# Patient Record
Sex: Female | Born: 1965 | Hispanic: Yes | Smoking: Never smoker
Health system: Southern US, Community
[De-identification: ages and names within clinical notes are randomized; demographics above are authoritative.]

## PROBLEM LIST (undated history)

## (undated) DIAGNOSIS — E785 Hyperlipidemia, unspecified: Secondary | ICD-10-CM

## (undated) DIAGNOSIS — K219 Gastro-esophageal reflux disease without esophagitis: Secondary | ICD-10-CM

## (undated) DIAGNOSIS — F419 Anxiety disorder, unspecified: Secondary | ICD-10-CM

## (undated) DIAGNOSIS — F32A Depression, unspecified: Secondary | ICD-10-CM

## (undated) DIAGNOSIS — E079 Disorder of thyroid, unspecified: Secondary | ICD-10-CM

## (undated) DIAGNOSIS — Z77090 Contact with and (suspected) exposure to asbestos: Secondary | ICD-10-CM

## (undated) DIAGNOSIS — J45909 Unspecified asthma, uncomplicated: Secondary | ICD-10-CM

## (undated) HISTORY — DX: Depression, unspecified: F32.A

## (undated) HISTORY — DX: Hyperlipidemia, unspecified: E78.5

## (undated) HISTORY — DX: Gastro-esophageal reflux disease without esophagitis: K21.9

## (undated) HISTORY — PX: ABDOMINAL HYSTERECTOMY: SHX81

## (undated) HISTORY — PX: TUBAL LIGATION: SHX77

## (undated) HISTORY — DX: Anxiety disorder, unspecified: F41.9

---

## 2005-04-13 ENCOUNTER — Ambulatory Visit (HOSPITAL_COMMUNITY): Admission: RE | Admit: 2005-04-13 | Discharge: 2005-04-13 | Payer: Self-pay | Admitting: Family Medicine

## 2009-07-12 ENCOUNTER — Ambulatory Visit: Payer: Self-pay | Admitting: Obstetrics & Gynecology

## 2009-07-12 LAB — CONVERTED CEMR LAB
HCT: 30.6 % — ABNORMAL LOW (ref 36.0–46.0)
Hemoglobin: 8.7 g/dL — ABNORMAL LOW (ref 12.0–15.0)
MCHC: 28.4 g/dL — ABNORMAL LOW (ref 30.0–36.0)
MCV: 65.7 fL — ABNORMAL LOW (ref 78.0–100.0)
Platelets: 368 10*3/uL (ref 150–400)
RBC: 4.66 M/uL (ref 3.87–5.11)
RDW: 22.6 % — ABNORMAL HIGH (ref 11.5–15.5)
WBC: 6.2 10*3/uL (ref 4.0–10.5)

## 2009-08-12 ENCOUNTER — Ambulatory Visit: Payer: Self-pay | Admitting: Obstetrics and Gynecology

## 2009-08-12 ENCOUNTER — Inpatient Hospital Stay (HOSPITAL_COMMUNITY): Admission: RE | Admit: 2009-08-12 | Discharge: 2009-08-14 | Payer: Self-pay | Admitting: Obstetrics and Gynecology

## 2009-08-19 ENCOUNTER — Ambulatory Visit: Payer: Self-pay | Admitting: Obstetrics & Gynecology

## 2009-08-28 ENCOUNTER — Ambulatory Visit: Payer: Self-pay | Admitting: Obstetrics & Gynecology

## 2009-09-25 ENCOUNTER — Ambulatory Visit: Payer: Self-pay | Admitting: Obstetrics and Gynecology

## 2009-10-14 ENCOUNTER — Encounter: Admission: RE | Admit: 2009-10-14 | Discharge: 2009-10-14 | Payer: Self-pay | Admitting: Specialist

## 2009-12-18 ENCOUNTER — Encounter: Admission: RE | Admit: 2009-12-18 | Discharge: 2009-12-18 | Payer: Self-pay | Admitting: Specialist

## 2010-10-12 ENCOUNTER — Encounter: Payer: Self-pay | Admitting: Specialist

## 2010-10-12 ENCOUNTER — Encounter: Payer: Self-pay | Admitting: Family Medicine

## 2010-12-24 LAB — CROSSMATCH
ABO/RH(D): B POS
Antibody Screen: NEGATIVE

## 2010-12-24 LAB — CBC
HCT: 26.2 % — ABNORMAL LOW (ref 36.0–46.0)
HCT: 33.8 % — ABNORMAL LOW (ref 36.0–46.0)
Hemoglobin: 10.6 g/dL — ABNORMAL LOW (ref 12.0–15.0)
Hemoglobin: 8.3 g/dL — ABNORMAL LOW (ref 12.0–15.0)
MCHC: 31.4 g/dL (ref 30.0–36.0)
MCHC: 31.5 g/dL (ref 30.0–36.0)
MCV: 71.2 fL — ABNORMAL LOW (ref 78.0–100.0)
MCV: 71.8 fL — ABNORMAL LOW (ref 78.0–100.0)
Platelets: 268 10*3/uL (ref 150–400)
Platelets: 337 10*3/uL (ref 150–400)
RBC: 3.65 MIL/uL — ABNORMAL LOW (ref 3.87–5.11)
RBC: 4.75 MIL/uL (ref 3.87–5.11)
RDW: 25.5 % — ABNORMAL HIGH (ref 11.5–15.5)
RDW: 26.3 % — ABNORMAL HIGH (ref 11.5–15.5)
WBC: 10.8 10*3/uL — ABNORMAL HIGH (ref 4.0–10.5)
WBC: 8.1 10*3/uL (ref 4.0–10.5)

## 2010-12-24 LAB — ABO/RH: ABO/RH(D): B POS

## 2010-12-24 LAB — PREGNANCY, URINE: Preg Test, Ur: NEGATIVE

## 2019-06-21 ENCOUNTER — Emergency Department (HOSPITAL_COMMUNITY)
Admission: EM | Admit: 2019-06-21 | Discharge: 2019-06-21 | Disposition: A | Discharge status: Home / Self Care | Payer: Medicaid Other | Attending: Emergency Medicine | Admitting: Emergency Medicine

## 2019-06-21 ENCOUNTER — Emergency Department (HOSPITAL_COMMUNITY): Payer: Medicaid Other

## 2019-06-21 ENCOUNTER — Other Ambulatory Visit: Payer: Self-pay

## 2019-06-21 ENCOUNTER — Encounter (HOSPITAL_COMMUNITY): Payer: Self-pay

## 2019-06-21 DIAGNOSIS — R1011 Right upper quadrant pain: Secondary | ICD-10-CM | POA: Insufficient documentation

## 2019-06-21 DIAGNOSIS — R748 Abnormal levels of other serum enzymes: Secondary | ICD-10-CM | POA: Diagnosis not present

## 2019-06-21 DIAGNOSIS — R112 Nausea with vomiting, unspecified: Secondary | ICD-10-CM | POA: Insufficient documentation

## 2019-06-21 DIAGNOSIS — R7989 Other specified abnormal findings of blood chemistry: Secondary | ICD-10-CM

## 2019-06-21 DIAGNOSIS — R109 Unspecified abdominal pain: Secondary | ICD-10-CM | POA: Diagnosis present

## 2019-06-21 HISTORY — DX: Disorder of thyroid, unspecified: E07.9

## 2019-06-21 HISTORY — DX: Contact with and (suspected) exposure to asbestos: Z77.090

## 2019-06-21 LAB — CBC
HCT: 41.9 % (ref 36.0–46.0)
Hemoglobin: 14 g/dL (ref 12.0–15.0)
MCH: 27.8 pg (ref 26.0–34.0)
MCHC: 33.4 g/dL (ref 30.0–36.0)
MCV: 83.1 fL (ref 80.0–100.0)
Platelets: 301 10*3/uL (ref 150–400)
RBC: 5.04 MIL/uL (ref 3.87–5.11)
RDW: 13.2 % (ref 11.5–15.5)
WBC: 7.4 10*3/uL (ref 4.0–10.5)
nRBC: 0 % (ref 0.0–0.2)

## 2019-06-21 LAB — COMPREHENSIVE METABOLIC PANEL
ALT: 155 U/L — ABNORMAL HIGH (ref 0–44)
AST: 123 U/L — ABNORMAL HIGH (ref 15–41)
Albumin: 4.3 g/dL (ref 3.5–5.0)
Alkaline Phosphatase: 122 U/L (ref 38–126)
Anion gap: 12 (ref 5–15)
BUN: 5 mg/dL — ABNORMAL LOW (ref 6–20)
CO2: 24 mmol/L (ref 22–32)
Calcium: 9.2 mg/dL (ref 8.9–10.3)
Chloride: 100 mmol/L (ref 98–111)
Creatinine, Ser: 0.49 mg/dL (ref 0.44–1.00)
GFR calc Af Amer: >60 mL/min (ref >60)
GFR calc non Af Amer: >60 mL/min (ref >60)
Glucose, Bld: 117 mg/dL — ABNORMAL HIGH (ref 70–99)
Potassium: 3.9 mmol/L (ref 3.5–5.1)
Sodium: 136 mmol/L (ref 135–145)
Total Bilirubin: 0.8 mg/dL (ref 0.3–1.2)
Total Protein: 7.8 g/dL (ref 6.5–8.1)

## 2019-06-21 LAB — URINALYSIS, ROUTINE W REFLEX MICROSCOPIC
Bilirubin Urine: NEGATIVE
Glucose, UA: NEGATIVE mg/dL
Hgb urine dipstick: NEGATIVE
Ketones, ur: 20 mg/dL — AB
Leukocytes,Ua: NEGATIVE
Nitrite: NEGATIVE
Protein, ur: NEGATIVE mg/dL
Specific Gravity, Urine: 1.005 (ref 1.005–1.030)
pH: 7 (ref 5.0–8.0)

## 2019-06-21 LAB — LIPASE, BLOOD: Lipase: 35 U/L (ref 11–51)

## 2019-06-21 MED ORDER — SODIUM CHLORIDE 0.9% FLUSH: 3.0000 mL | Freq: Once | INTRAVENOUS | Status: DC

## 2019-06-21 MED ORDER — ONDANSETRON HCL 4 MG/2ML IJ SOLN
4.0000 mg | Freq: Once | INTRAMUSCULAR | Status: AC
  Administered 2019-06-21: 4 mg via INTRAVENOUS
  Filled 2019-06-21: qty 2

## 2019-06-21 MED ORDER — DICYCLOMINE HCL 10 MG PO CAPS
10.0000 mg | ORAL_CAPSULE | Freq: Once | ORAL | Status: AC
  Administered 2019-06-21: 10 mg via ORAL

## 2019-06-21 MED ORDER — PROMETHAZINE HCL 25 MG/ML IJ SOLN
12.5000 mg | Freq: Once | INTRAMUSCULAR | Status: AC
  Administered 2019-06-21: 12.5 mg via INTRAVENOUS
  Filled 2019-06-21: qty 1

## 2019-06-21 MED ORDER — PROMETHAZINE HCL 25 MG PO TABS: 25.0000 mg | ORAL_TABLET | Freq: Four times a day (QID) | ORAL | 0 refills | Status: DC | PRN

## 2019-06-21 NOTE — ED Notes (Signed)
Attempted to give pt bentyl, pt is in the restroom vomiting.

## 2019-06-21 NOTE — ED Provider Notes (Signed)
Emergency Department Provider Note   I have reviewed the triage vital signs and the nursing notes.   HISTORY  Chief Complaint Abdominal Pain   HPI Jennifer Wright is a 53 y.o. female with PMH of asbestos exposure presents to the emergency department for evaluation of nausea, vomiting, abdominal pain for the past 3 days.  Patient describes pain as being worse top of her abdomen and somewhat worse with eating.  She was exposed to a sick grandchild last week with a stomach virus but did not immediately develop symptoms.  She has had some mild diarrhea and felt somewhat shaky this morning.  She denied specific chest pain but states that she does have some pain in her lungs which is been an ongoing issue and thought to be related to asbestos exposure. No other radiation of symptoms or modifying factors.   Past Medical History:  Diagnosis Date  . Asbestos exposure   . Thyroid disease     There are no active problems to display for this patient.   Past Surgical History:  Procedure Laterality Date  . ABDOMINAL HYSTERECTOMY      Allergies Patient has no known allergies.  No family history on file.  Social History Social History   Tobacco Use  . Smoking status: Never Smoker  Substance Use Topics  . Alcohol use: Never    Frequency: Never  . Drug use: Never    Review of Systems  Constitutional: No fever/chills Eyes: No visual changes. ENT: No sore throat. Cardiovascular: Denies chest pain. Respiratory: Denies shortness of breath. Gastrointestinal: Positive abdominal pain.  Positive nausea, vomiting, and diarrhea.  No constipation. Genitourinary: Negative for dysuria. Musculoskeletal: Negative for back pain. Skin: Negative for rash. Neurological: Negative for headaches, focal weakness or numbness.  10-point ROS otherwise negative.  ____________________________________________   PHYSICAL EXAM:  VITAL SIGNS: ED Triage Vitals  Enc Vitals Group     BP 06/21/19  0954 107/73     Pulse Rate 06/21/19 0954 75     Resp 06/21/19 0955 16     Temp 06/21/19 0954 97.8 F (36.6 C)     Temp Source 06/21/19 0954 Oral     SpO2 06/21/19 0954 98 %     Weight 06/21/19 0951 125 lb (56.7 kg)     Height 06/21/19 0951 5\' 2"  (1.575 m)   Constitutional: Alert and oriented. Well appearing and in no acute distress. Eyes: Conjunctivae are normal.  Head: Atraumatic. Nose: No congestion/rhinnorhea. Mouth/Throat: Mucous membranes are moist.  Neck: No stridor.   Cardiovascular: Normal rate, regular rhythm. Good peripheral circulation. Grossly normal heart sounds.   Respiratory: Normal respiratory effort.  No retractions. Lungs CTAB. Gastrointestinal: Soft with mild diffuse pain worse in the RUQ. No distention.  Musculoskeletal: No lower extremity tenderness nor edema. No gross deformities of extremities. Neurologic:  Normal speech and language. No gross focal neurologic deficits are appreciated.  Skin:  Skin is warm, dry and intact. No rash noted.  ____________________________________________   LABS (all labs ordered are listed, but only abnormal results are displayed)  Labs Reviewed  COMPREHENSIVE METABOLIC PANEL - Abnormal; Notable for the following components:      Result Value   Glucose, Bld 117 (*)    BUN 5 (*)    AST 123 (*)    ALT 155 (*)    All other components within normal limits  URINALYSIS, ROUTINE W REFLEX MICROSCOPIC - Abnormal; Notable for the following components:   APPearance HAZY (*)    Ketones,  ur 20 (*)    All other components within normal limits  LIPASE, BLOOD  CBC   ____________________________________________  RADIOLOGY  Dg Chest 2 View  Result Date: 06/21/2019 CLINICAL DATA:  Chest pain EXAM: CHEST - 2 VIEW COMPARISON:  10/14/2009 FINDINGS: Heart and mediastinal contours are within normal limits. No focal opacities or effusions. No acute bony abnormality. IMPRESSION: No active cardiopulmonary disease. Electronically Signed   By:  Rolm Baptise M.D.   On: 06/21/2019 10:31   US Abdomen Limited Ruq  Result Date: 06/21/2019 CLINICAL DATA:  Right upper quadrant pain EXAM: ULTRASOUND ABDOMEN LIMITED RIGHT UPPER QUADRANT COMPARISON:  None. FINDINGS: Gallbladder: No gallstones or wall thickening visualized. No sonographic Murphy sign noted by sonographer. Common bile duct: Diameter: 5.3 mm Liver: No focal lesion identified. Increased hepatic parenchymal echogenicity. Portal vein is patent on color Doppler imaging with normal direction of blood flow towards the liver. Other: None. IMPRESSION: 1. No cholelithiasis or sonographic evidence of acute cholecystitis. 2. Increased hepatic echogenicity as can be seen with hepatic steatosis. Electronically Signed   By: Kathreen Devoid   On: 06/21/2019 11:13    ____________________________________________   PROCEDURES  Procedure(s) performed:   Procedures  None  ____________________________________________   INITIAL IMPRESSION / ASSESSMENT AND PLAN / ED COURSE  Pertinent labs & imaging results that were available during my care of the patient were reviewed by me and considered in my medical decision making (see chart for details).   Patient presents to the emergency department for evaluation of abdominal pain with nausea, vomiting, diarrhea.  Differential includes viral etiology but increase suspicion for gallbladder pathology.  He does have more focal tenderness in the epigastric and right upper quadrant region.  Afebrile so doubt hepatitis.  Plan to obtain labs, right upper quadrant ultrasound, Zofran.  Will obtain chest x-ray but patient's respiratory symptoms seem more chronic.   Patient feeling improved after Phenergan.  She is drinking fluids in the emergency department without vomiting.  She is ambulatory in the department without difficulty.  Vital signs remain within normal limits.  Discussed PCP follow-up for repeat liver labs in 1 week with the patient and daughter at bedside  using interpreter.  Discussed ED return precautions in detail. ____________________________________________  FINAL CLINICAL IMPRESSION(S) / ED DIAGNOSES  Final diagnoses:  RUQ abdominal pain  Elevated LFTs  Non-intractable vomiting with nausea, unspecified vomiting type     MEDICATIONS GIVEN DURING THIS VISIT:  Medications  ondansetron (ZOFRAN) injection 4 mg (4 mg Intravenous Given 06/21/19 1029)  dicyclomine (BENTYL) capsule 10 mg (10 mg Oral Given 06/21/19 1403)  promethazine (PHENERGAN) injection 12.5 mg (12.5 mg Intravenous Given 06/21/19 1232)     NEW OUTPATIENT MEDICATIONS STARTED DURING THIS VISIT:  Discharge Medication List as of 06/21/2019  1:51 PM    START taking these medications   Details  promethazine (PHENERGAN) 25 MG tablet Take 1 tablet (25 mg total) by mouth every 6 (six) hours as needed for nausea or vomiting., Starting Wed 06/21/2019, Print        Note:  This document was prepared using Dragon voice recognition software and may include unintentional dictation errors.  Nanda Quinton, MD, [facility] Emergency Medicine    Kesha Hurrell, [facility], MD 06/22/19 [phone]

## 2019-06-21 NOTE — ED Triage Notes (Addendum)
Pt reports skin sore to touch for 3 days. Reports N/V for 3 days. Last vomiting 10 mins ago Bile substance  Some diarrhea, reports shaky this morning. Grandchild had stomach virus last week

## 2019-06-21 NOTE — Discharge Instructions (Signed)
Hoy lo vieron en el departamento de emergencias con nuseas, vmitos, dolor abdominal. Sus enzimas hepticas estaban ligeramente elevadas. Esto puede deberse a un virus. Necesita repetir [employer] en 1 semana. Comunquese con su mdico de atencin primaria para esto. Regrese al departamento de emergencias con fiebre, dolor que [place], vmitos que [place]. Puede tomar Phenergan segn sea necesario para las nuseas y los vmitos. Llame a su mdico de atencin primaria hoy para programar una cita.    You were seen in the emergency department today with nausea, vomiting, abdominal pain.  Your liver enzymes were slightly elevated.  This may be from a virus.  You need to have these labs repeated in 1 week.  Please contact your primary care doctor for this.  Please return to the emergency department with any fever, worsening pain, worsening vomiting.  You may take the Phenergan as needed for nausea and vomiting.  Please call your primary care doctor today to schedule an appointment.

## 2019-12-07 ENCOUNTER — Encounter: Payer: Self-pay | Admitting: Family Medicine

## 2019-12-07 ENCOUNTER — Encounter (INDEPENDENT_AMBULATORY_CARE_PROVIDER_SITE_OTHER): Payer: Self-pay | Admitting: *Deleted

## 2019-12-07 ENCOUNTER — Other Ambulatory Visit: Payer: Self-pay

## 2019-12-07 ENCOUNTER — Ambulatory Visit (INDEPENDENT_AMBULATORY_CARE_PROVIDER_SITE_OTHER): Payer: Medicaid Other | Admitting: Family Medicine

## 2019-12-07 VITALS — BP 108/78 | HR 72 | Temp 97.1°F | Ht 62.0 in | Wt 134.0 lb

## 2019-12-07 DIAGNOSIS — M542 Cervicalgia: Secondary | ICD-10-CM

## 2019-12-07 DIAGNOSIS — R0602 Shortness of breath: Secondary | ICD-10-CM

## 2019-12-07 DIAGNOSIS — R519 Headache, unspecified: Secondary | ICD-10-CM

## 2019-12-07 DIAGNOSIS — M549 Dorsalgia, unspecified: Secondary | ICD-10-CM

## 2019-12-07 DIAGNOSIS — Z833 Family history of diabetes mellitus: Secondary | ICD-10-CM

## 2019-12-07 DIAGNOSIS — Z77090 Contact with and (suspected) exposure to asbestos: Secondary | ICD-10-CM

## 2019-12-07 DIAGNOSIS — E039 Hypothyroidism, unspecified: Secondary | ICD-10-CM | POA: Diagnosis not present

## 2019-12-07 DIAGNOSIS — E611 Iron deficiency: Secondary | ICD-10-CM

## 2019-12-07 DIAGNOSIS — Z83438 Family history of other disorder of lipoprotein metabolism and other lipidemia: Secondary | ICD-10-CM

## 2019-12-07 DIAGNOSIS — E559 Vitamin D deficiency, unspecified: Secondary | ICD-10-CM | POA: Diagnosis not present

## 2019-12-07 DIAGNOSIS — Z789 Other specified health status: Secondary | ICD-10-CM | POA: Insufficient documentation

## 2019-12-07 DIAGNOSIS — M25519 Pain in unspecified shoulder: Secondary | ICD-10-CM

## 2019-12-07 DIAGNOSIS — F321 Major depressive disorder, single episode, moderate: Secondary | ICD-10-CM | POA: Insufficient documentation

## 2019-12-07 DIAGNOSIS — Z1211 Encounter for screening for malignant neoplasm of colon: Secondary | ICD-10-CM

## 2019-12-07 HISTORY — DX: Family history of other disorder of lipoprotein metabolism and other lipidemia: Z83.438

## 2019-12-07 HISTORY — DX: Cervicalgia: M54.2

## 2019-12-07 HISTORY — DX: Headache, unspecified: R51.9

## 2019-12-07 HISTORY — DX: Dorsalgia, unspecified: M54.9

## 2019-12-07 HISTORY — DX: Shortness of breath: R06.02

## 2019-12-07 HISTORY — DX: Family history of diabetes mellitus: Z83.3

## 2019-12-07 HISTORY — DX: Pain in unspecified shoulder: M25.519

## 2019-12-07 HISTORY — DX: Encounter for screening for malignant neoplasm of colon: Z12.11

## 2019-12-07 NOTE — Assessment & Plan Note (Signed)
Needs updated labs denies having any signs or symptoms of hypothyroidism at this time.

## 2019-12-07 NOTE — Assessment & Plan Note (Signed)
Continue over-the-counter treatments at this time.

## 2019-12-07 NOTE — Assessment & Plan Note (Signed)
Continues to have shortness of breath uses her rescue inhaler several times.  Has never truly been checked but used to work with asbestos removal.  Is willing to get a pulmonology referral made today

## 2019-12-07 NOTE — Assessment & Plan Note (Signed)
Work with clearing asbestos.  Is on Symbicort and uses short acting inhaler frequently.  Referral to pulmonologist

## 2019-12-07 NOTE — Assessment & Plan Note (Signed)
Needs updated labs to see if she needs prescription strength supplement

## 2019-12-07 NOTE — Assessment & Plan Note (Signed)
Unsure of true cause here she reports has been lasting for a long time ever since she was out of being incarcerated.  Possibly could be related to mood and depression.  We will follow-up after therapy

## 2019-12-07 NOTE — Assessment & Plan Note (Signed)
Will be getting labs she is encouraged to maintain a low-fat diet.

## 2019-12-07 NOTE — Assessment & Plan Note (Signed)
Unsure of true cause here she reports has been lasting for a long time ever since she was out of being incarcerated.  Possibly could be related to mood and depression.  We will follow-up after therapy 

## 2019-12-07 NOTE — Assessment & Plan Note (Signed)
Needs updated labs to see if she needs supplementation.
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
## 2019-12-07 NOTE — Assessment & Plan Note (Signed)
Predominantly Spanish-speaking does understand some [place].  Daughter was with her to help with translation.

## 2019-12-07 NOTE — Assessment & Plan Note (Signed)
Colonoscopy need referral made today

## 2019-12-07 NOTE — Assessment & Plan Note (Signed)
Is seeing a group that manages her medications they are unsure of the name of the group they will call back with the name of the group. Until then we will keep everything the same.  But will refer to behavioral health for therapy, as she would like to leave the group that she is currently bit because she does not feel like she gets what she needs. Denies having any SI and HI at this time.

## 2019-12-07 NOTE — Patient Instructions (Addendum)
I appreciate the opportunity to provide you with care for your health and wellness. Today we discussed: establish care   Follow up: 3 months   Labs today  Referrals to GI-colonoscopy, Therapy and Pulmonology-lung dr  If not feeling better in back and neck, we will look at PT referral Until then increase walking and stretching daily  Please continue to practice social distancing to keep you, your family, and our community safe.  If you must go out, please wear a mask and practice good handwashing.  It was a pleasure to see you and I look forward to continuing to work together on your health and well-being. Please do not hesitate to call the office if you need care or have questions about your care.  Have a wonderful day and week. With Gratitude, Tereasa Coop, DNP, AGNP-BC

## 2019-12-07 NOTE — Assessment & Plan Note (Signed)
Needs updated baseline labs. She is encouraged to maintain a healthy diet low in carbohydrates and sugars

## 2019-12-08 ENCOUNTER — Other Ambulatory Visit: Payer: Self-pay | Admitting: Family Medicine

## 2019-12-08 DIAGNOSIS — E559 Vitamin D deficiency, unspecified: Secondary | ICD-10-CM

## 2019-12-08 DIAGNOSIS — E782 Mixed hyperlipidemia: Secondary | ICD-10-CM

## 2019-12-08 LAB — COMPLETE METABOLIC PANEL WITH GFR
AG Ratio: 1.6 (calc) (ref 1.0–2.5)
ALT: 28 U/L (ref 6–29)
AST: 34 U/L (ref 10–35)
Albumin: 4.4 g/dL (ref 3.6–5.1)
Alkaline phosphatase (APISO): 107 U/L (ref 37–153)
BUN: 12 mg/dL (ref 7–25)
CO2: 28 mmol/L (ref 20–32)
Calcium: 9.3 mg/dL (ref 8.6–10.4)
Chloride: 105 mmol/L (ref 98–110)
Creat: 0.6 mg/dL (ref 0.50–1.05)
GFR, Est African American: 122 mL/min/{1.73_m2} (ref >=60)
GFR, Est Non African American: 106 mL/min/{1.73_m2} (ref >=60)
Globulin: 2.7 g/dL (calc) (ref 1.9–3.7)
Glucose, Bld: 98 mg/dL (ref 65–99)
Potassium: 4.2 mmol/L (ref 3.5–5.3)
Sodium: 138 mmol/L (ref 135–146)
Total Bilirubin: 0.2 mg/dL (ref 0.2–1.2)
Total Protein: 7.1 g/dL (ref 6.1–8.1)

## 2019-12-08 LAB — TSH: TSH: 4.42 mIU/L

## 2019-12-08 LAB — HEMOGLOBIN A1C
Hgb A1c MFr Bld: 5.2 % of total Hgb (ref <5.7)
Mean Plasma Glucose: 103 (calc)
eAG (mmol/L): 5.7 (calc)

## 2019-12-08 LAB — LIPID PANEL
Cholesterol: 242 mg/dL — ABNORMAL HIGH (ref <200)
HDL: 60 mg/dL (ref >=50)
LDL Cholesterol (Calc): 149 mg/dL (calc) — ABNORMAL HIGH
Non-HDL Cholesterol (Calc): 182 mg/dL (calc) — ABNORMAL HIGH (ref <130)
Total CHOL/HDL Ratio: 4 (calc) (ref <5.0)
Triglycerides: 192 mg/dL — ABNORMAL HIGH (ref <150)

## 2019-12-08 LAB — CBC
HCT: 38.3 % (ref 35.0–45.0)
Hemoglobin: 12.8 g/dL (ref 11.7–15.5)
MCH: 27.8 pg (ref 27.0–33.0)
MCHC: 33.4 g/dL (ref 32.0–36.0)
MCV: 83.1 fL (ref 80.0–100.0)
MPV: 8.9 fL (ref 7.5–12.5)
Platelets: 323 10*3/uL (ref 140–400)
RBC: 4.61 10*6/uL (ref 3.80–5.10)
RDW: 13.3 % (ref 11.0–15.0)
WBC: 7.2 10*3/uL (ref 3.8–10.8)

## 2019-12-08 LAB — VITAMIN D 25 HYDROXY (VIT D DEFICIENCY, FRACTURES): Vit D, 25-Hydroxy: 12 ng/mL — ABNORMAL LOW (ref 30–100)

## 2019-12-08 MED ORDER — ROSUVASTATIN CALCIUM 5 MG PO TABS: 5.0000 mg | ORAL_TABLET | Freq: Every day | ORAL | 1 refills | Status: DC

## 2019-12-08 MED ORDER — VITAMIN D (ERGOCALCIFEROL) 1.25 MG (50000 UNIT) PO CAPS: 50000.0000 [IU] | ORAL_CAPSULE | ORAL | 1 refills | Status: DC

## 2020-01-31 ENCOUNTER — Telehealth: Payer: Self-pay | Admitting: Internal Medicine

## 2020-02-02 NOTE — Telephone Encounter (Signed)
Still no workers comp paperwork - The case Production designer, theatre/television/film needs to fax it to my attn. Once I receive it I will see if one the providers will be okay with seeing the patient, and contact the case manager back to schedule an appointment. -pr

## 2020-02-02 NOTE — Telephone Encounter (Signed)
Spoke with the pt and notified of response per Rodell Perna and she verbalized understanding.

## 2020-02-02 NOTE — Telephone Encounter (Signed)
Please advise 

## 2020-02-09 ENCOUNTER — Ambulatory Visit (INDEPENDENT_AMBULATORY_CARE_PROVIDER_SITE_OTHER): Payer: Medicaid Other | Admitting: Family Medicine

## 2020-02-09 ENCOUNTER — Other Ambulatory Visit: Payer: Self-pay

## 2020-02-09 ENCOUNTER — Encounter: Payer: Self-pay | Admitting: Family Medicine

## 2020-02-09 VITALS — BP 109/79 | HR 72 | Temp 97.4°F | Ht 62.0 in | Wt 135.4 lb

## 2020-02-09 DIAGNOSIS — R0602 Shortness of breath: Secondary | ICD-10-CM

## 2020-02-09 DIAGNOSIS — E039 Hypothyroidism, unspecified: Secondary | ICD-10-CM | POA: Diagnosis not present

## 2020-02-09 DIAGNOSIS — K219 Gastro-esophageal reflux disease without esophagitis: Secondary | ICD-10-CM

## 2020-02-09 DIAGNOSIS — R3915 Urgency of urination: Secondary | ICD-10-CM

## 2020-02-09 DIAGNOSIS — M25552 Pain in left hip: Secondary | ICD-10-CM

## 2020-02-09 DIAGNOSIS — F321 Major depressive disorder, single episode, moderate: Secondary | ICD-10-CM

## 2020-02-09 DIAGNOSIS — Z789 Other specified health status: Secondary | ICD-10-CM

## 2020-02-09 DIAGNOSIS — E559 Vitamin D deficiency, unspecified: Secondary | ICD-10-CM | POA: Diagnosis not present

## 2020-02-09 DIAGNOSIS — N3281 Overactive bladder: Secondary | ICD-10-CM

## 2020-02-09 DIAGNOSIS — M25562 Pain in left knee: Secondary | ICD-10-CM

## 2020-02-09 DIAGNOSIS — E782 Mixed hyperlipidemia: Secondary | ICD-10-CM

## 2020-02-09 DIAGNOSIS — E611 Iron deficiency: Secondary | ICD-10-CM

## 2020-02-09 LAB — POCT URINALYSIS DIP (CLINITEK)
Bilirubin, UA: NEGATIVE
Blood, UA: NEGATIVE
Glucose, UA: NEGATIVE mg/dL
Ketones, POC UA: NEGATIVE mg/dL
Leukocytes, UA: NEGATIVE
Nitrite, UA: NEGATIVE
POC PROTEIN,UA: NEGATIVE
Spec Grav, UA: 1.01 (ref 1.010–1.025)
Urobilinogen, UA: 0.2 E.U./dL
pH, UA: 6 (ref 5.0–8.0)

## 2020-02-09 MED ORDER — OMEPRAZOLE 20 MG PO CPDR: 20.0000 mg | DELAYED_RELEASE_CAPSULE | Freq: Every day | ORAL | 3 refills | Status: DC

## 2020-02-09 NOTE — Progress Notes (Signed)
Subjective:  Patient ID: Jennifer Wright, female    DOB: 03-25-1966  Age: 54 y.o. MRN: [id number]  CC:  Chief Complaint  Patient presents with  . Knee Pain  . Nausea/GERD      HPI  HPI  [name] is a 54 year old female patient of mine.  Who presents today for several concerns.  She also presents today with a translator to help secondary to her being predominantly Spanish-speaking.  Today she has concerns that include nausea, knee pain, waist and back discomfort.  Knee pain: Left knee pain for the last 3 weeks.  At its worst 8 out of 10 pain scale.  In the evening all the pain hurts down her leg is sometimes into the "bone".  Sometimes she feels like her muscles are hurting but is concerned because she is losing strength in her legs.  She has tried Tylenol but does not think that it helps and it also causes her to have heartburn.  She denies having any trauma or injury.  She reports that she has leg weakness predominantly in the left side.  And describes what sounds like sciatica at times.  Nausea: When hunger comes it is followed by nausea.  This propels her from eating anything especially after she smells food.  She is very tired and sleepy and having difficulty concentrating for the last month secondary to having the nausea because she is not able to eat much.  She denies having any vomiting or any other stomach difficulties including changes in bowel habits.  In description she sometimes refers to this as a discomfort in her chest similar to that of heartburn.  In discussion of waist and back discomfort she reports that she is peeing on herself and this is very embarrassing and she does not like the smell.  She has had 6 children.  She is unfamiliar with [place] exercises.  She denies having any burning sensation with voiding.  She denies having any unilateral back pain.  She denies having any urgency or frequency.  She does reports that she is unable to hold her urine and she just leaks  on herself.  She does wear pads at times.  Additionally she has had a hysterectomy and might have prolapse of bladder.  She is open to referral to urology to help get help with this and GYN if needed.   Today patient denies signs and symptoms of COVID 19 infection including fever, chills, cough, shortness of breath, and headache. Past Medical, Surgical, Social History, Allergies, and Medications have been Reviewed.   Past Medical History:  Diagnosis Date  . Asbestos exposure   . Encounter for screening for malignant neoplasm of colon 12/07/2019  . Family history of diabetes mellitus 12/07/2019  . Family history of hyperlipidemia 12/07/2019  . Generalized headaches 12/07/2019  . Neck and shoulder pain 12/07/2019  . Shortness of breath 12/07/2019  . Thyroid disease   . Upper back pain 12/07/2019    Current Meds  Medication Sig  . albuterol (VENTOLIN HFA) 108 (90 Base) MCG/ACT inhaler Inhale 1 puff into the lungs every 6 (six) hours as needed for wheezing or shortness of breath.   . budesonide-formoterol (SYMBICORT) 160-4.5 MCG/ACT inhaler Inhale 2 puffs into the lungs daily.   . hydrOXYzine (ATARAX/VISTARIL) 25 MG tablet Take 25 mg by mouth 3 (three) times daily as needed.  . hyoscyamine (ANASPAZ) 0.125 MG TBDP disintergrating tablet Place 0.125-0.25 mg under the tongue every 6 (six) hours as needed for  bladder spasms or cramping.  Marland Kitchen levothyroxine (SYNTHROID) 75 MCG tablet Take 75 mcg by mouth daily before breakfast.  . promethazine (PHENERGAN) 25 MG tablet Take 1 tablet (25 mg total) by mouth every 6 (six) hours as needed for nausea or vomiting.  . sertraline (ZOLOFT) 50 MG tablet Take 50 mg by mouth daily.  Marland Kitchen tiotropium (SPIRIVA) 18 MCG inhalation capsule Place 18 mcg into inhaler and inhale daily.  . Vitamin D, Ergocalciferol, (DRISDOL) 1.25 MG (50000 UNIT) CAPS capsule Take 1 capsule (50,000 Units total) by mouth every 7 (seven) days.  . [DISCONTINUED] rosuvastatin (CRESTOR) 5 MG tablet  Take 1 tablet (5 mg total) by mouth daily.    ROS:  Review of Systems  Constitutional: Negative.   HENT: Negative.   Eyes: Negative.   Respiratory: Negative.   Cardiovascular: Negative.   Gastrointestinal: Negative.        See HPI  Genitourinary: Negative.        See HPI  Musculoskeletal: Negative.   Skin: Negative.   Neurological: Negative.   Endo/Heme/Allergies: Negative.   Psychiatric/Behavioral: Negative.   All other systems reviewed and are negative.    Objective:   Today's Vitals: BP 109/79 (BP Location: Left Arm, Patient Position: Sitting, Cuff Size: Normal)   Pulse 72   Temp (!) 97.4 F (36.3 C) (Temporal)   Ht 5\' 2"  (1.575 m)   Wt 135 lb 6.4 oz (61.4 kg)   SpO2 95%   BMI 24.76 kg/m  Vitals with BMI 02/09/2020 12/07/2019 06/21/2019  Height 5\' 2"  5\' 2"  -  Weight 135 lbs 6 oz 134 lbs -  BMI 73.53 29.9 -  Systolic 242 683 419  Diastolic 79 78 74  Pulse 72 72 75     Physical Exam Vitals and nursing note reviewed.  Constitutional:      Appearance: Normal appearance. She is well-developed, well-groomed and normal weight.  HENT:     Head: Normocephalic and atraumatic.     Right Ear: External ear normal.     Left Ear: External ear normal.     Mouth/Throat:     Comments: Mask in place Eyes:     General:        Right eye: No discharge.        Left eye: No discharge.     Conjunctiva/sclera: Conjunctivae normal.  Cardiovascular:     Rate and Rhythm: Normal rate and regular rhythm.     Pulses: Normal pulses.     Heart sounds: Normal heart sounds.  Pulmonary:     Effort: Pulmonary effort is normal.     Breath sounds: Normal breath sounds.  Musculoskeletal:        General: Normal range of motion.     Cervical back: Normal range of motion and neck supple.     Comments: She reports tenderness to touch in the lower lumbar on the right.  No tenderness on the left lumbar.  She reports some tenderness going down around the acetabulum into the IT band area.   Predominantly on the left.  None on the right.  She has joint discomfort throughout the right knee with manipulation.  Difficulty to identify whether or not it because of fear of manipulation, the manipulation itself, or if she is having a lot of pain in general.  Skin:    General: Skin is warm.  Neurological:     General: No focal deficit present.     Mental Status: She is alert and oriented to person, place, and  time.  Psychiatric:        Attention and Perception: Attention normal.        Mood and Affect: Mood normal.        Speech: Speech normal.        Behavior: Behavior normal. Behavior is cooperative.        Thought Content: Thought content normal.        Cognition and Memory: Cognition normal.        Judgment: Judgment normal.      Depression screen [facility] 2/9 02/09/2020 12/07/2019  Decreased Interest 3 3  Down, Depressed, Hopeless 3 3  PHQ - 2 Score 6 6  Altered sleeping 3 3  Tired, decreased energy 3 3  Change in appetite 3 1  Feeling bad or failure about yourself  2 1  Trouble concentrating 3 0  Moving slowly or fidgety/restless 0 0  Suicidal thoughts 0 0  PHQ-9 Score 20 14  Difficult doing work/chores Very difficult Very difficult    Assessment   1. Depression, major, single episode, moderate (HCC)   2. Vitamin D deficiency   3. Mixed hyperlipidemia   4. Hypothyroidism, unspecified type   5. Iron deficiency   6. Shortness of breath   7. Urgency of urination   8. Overactive bladder   9. Acute pain of left knee   10. Left hip pain   11. Gastroesophageal reflux disease, unspecified whether esophagitis present   12. Language barrier to communication     Tests ordered Orders Placed This Encounter  Procedures  . CBC  . COMPLETE METABOLIC PANEL WITH GFR  . Lipid panel  . TSH  . VITAMIN D 25 Hydroxy (Vit-D Deficiency, Fractures)  . Vitamin B12  . Ambulatory referral to Urology  . Ambulatory referral to Orthopedic Surgery  . POCT URINALYSIS DIP (CLINITEK)      Plan: Please see assessment and plan per problem list above.   Meds ordered this encounter  Medications  . omeprazole (PRILOSEC) 20 MG capsule    Sig: Take 1 capsule (20 mg total) by mouth daily.    Dispense:  30 capsule    Refill:  3    Order Specific Question:   Supervising Provider    Answer:   Kerri Perches [[id number]]    Patient to follow-up in 3 months   Freddy Finner, NP

## 2020-02-10 LAB — VITAMIN B12: Vitamin B-12: 638 pg/mL (ref 200–1100)

## 2020-02-10 LAB — COMPLETE METABOLIC PANEL WITH GFR
AG Ratio: 1.7 (calc) (ref 1.0–2.5)
ALT: 23 U/L (ref 6–29)
AST: 37 U/L — ABNORMAL HIGH (ref 10–35)
Albumin: 4.8 g/dL (ref 3.6–5.1)
Alkaline phosphatase (APISO): 95 U/L (ref 37–153)
BUN: 10 mg/dL (ref 7–25)
CO2: 26 mmol/L (ref 20–32)
Calcium: 9.6 mg/dL (ref 8.6–10.4)
Chloride: 104 mmol/L (ref 98–110)
Creat: 0.61 mg/dL (ref 0.50–1.05)
GFR, Est African American: 121 mL/min/{1.73_m2} (ref >=60)
GFR, Est Non African American: 104 mL/min/{1.73_m2} (ref >=60)
Globulin: 2.8 g/dL (calc) (ref 1.9–3.7)
Glucose, Bld: 98 mg/dL (ref 65–99)
Potassium: 4.1 mmol/L (ref 3.5–5.3)
Sodium: 138 mmol/L (ref 135–146)
Total Bilirubin: 0.3 mg/dL (ref 0.2–1.2)
Total Protein: 7.6 g/dL (ref 6.1–8.1)

## 2020-02-10 LAB — LIPID PANEL
Cholesterol: 250 mg/dL — ABNORMAL HIGH (ref <200)
HDL: 62 mg/dL (ref >=50)
LDL Cholesterol (Calc): 160 mg/dL (calc) — ABNORMAL HIGH
Non-HDL Cholesterol (Calc): 188 mg/dL (calc) — ABNORMAL HIGH (ref <130)
Total CHOL/HDL Ratio: 4 (calc) (ref <5.0)
Triglycerides: 146 mg/dL (ref <150)

## 2020-02-10 LAB — CBC
HCT: 41.2 % (ref 35.0–45.0)
Hemoglobin: 13.7 g/dL (ref 11.7–15.5)
MCH: 27.6 pg (ref 27.0–33.0)
MCHC: 33.3 g/dL (ref 32.0–36.0)
MCV: 82.9 fL (ref 80.0–100.0)
MPV: 9.4 fL (ref 7.5–12.5)
Platelets: 352 10*3/uL (ref 140–400)
RBC: 4.97 10*6/uL (ref 3.80–5.10)
RDW: 13.2 % (ref 11.0–15.0)
WBC: 5 10*3/uL (ref 3.8–10.8)

## 2020-02-10 LAB — VITAMIN D 25 HYDROXY (VIT D DEFICIENCY, FRACTURES): Vit D, 25-Hydroxy: 41 ng/mL (ref 30–100)

## 2020-02-10 LAB — TSH: TSH: 3.12 mIU/L

## 2020-02-13 ENCOUNTER — Other Ambulatory Visit: Payer: Self-pay | Admitting: Family Medicine

## 2020-02-13 ENCOUNTER — Encounter: Payer: Self-pay | Admitting: Family Medicine

## 2020-02-13 DIAGNOSIS — N3000 Acute cystitis without hematuria: Secondary | ICD-10-CM | POA: Insufficient documentation

## 2020-02-13 DIAGNOSIS — N3281 Overactive bladder: Secondary | ICD-10-CM | POA: Insufficient documentation

## 2020-02-13 DIAGNOSIS — M25562 Pain in left knee: Secondary | ICD-10-CM | POA: Insufficient documentation

## 2020-02-13 DIAGNOSIS — M25552 Pain in left hip: Secondary | ICD-10-CM | POA: Insufficient documentation

## 2020-02-13 DIAGNOSIS — R3915 Urgency of urination: Secondary | ICD-10-CM | POA: Insufficient documentation

## 2020-02-13 DIAGNOSIS — G8929 Other chronic pain: Secondary | ICD-10-CM | POA: Insufficient documentation

## 2020-02-13 DIAGNOSIS — K219 Gastro-esophageal reflux disease without esophagitis: Secondary | ICD-10-CM | POA: Insufficient documentation

## 2020-02-13 DIAGNOSIS — E782 Mixed hyperlipidemia: Secondary | ICD-10-CM

## 2020-02-13 MED ORDER — ROSUVASTATIN CALCIUM 10 MG PO TABS: 10.0000 mg | ORAL_TABLET | Freq: Every day | ORAL | 0 refills | Status: DC

## 2020-02-13 NOTE — Assessment & Plan Note (Signed)
Encouraged to continue Crestor continue heart healthy diet.

## 2020-02-13 NOTE — Assessment & Plan Note (Addendum)
Is not using inhaler as much as she was.  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  Previously made pulmonology referral.  We will continue to follow.

## 2020-02-13 NOTE — Assessment & Plan Note (Signed)
Acute left hip pain denies having any injury.  Unsure of cause.  Will refer to Ortho as an identifiable cause today on exam.

## 2020-02-13 NOTE — Assessment & Plan Note (Signed)
Signs and symptoms of GERD will start Prilosec to see if this helps.  Advised to follow-up before 3 months appointment if she does not get any better.  Reviewed side effects, risks and benefits of medication.   Patient acknowledged agreement and understanding of the plan.  Additionally information provided about GERD in Spanish.

## 2020-02-13 NOTE — Assessment & Plan Note (Signed)
Predominantly Spanish-speaking does understand some English family was not here with her today to translate.  She did have a translator provided to her for this visit.  This does take more time and making sure that she does understand the plan of care.  She verbalized plan of care understanding via the translator today at the end of the visit.

## 2020-02-13 NOTE — Assessment & Plan Note (Addendum)
Signs and symptoms of urinary tract infection, however dip in office was negative.  Referral to urology as most likely urgency is related to overactive bladder.

## 2020-02-13 NOTE — Assessment & Plan Note (Addendum)
Acute left knee pain denies having any injury.  Unsure of cause.  Will refer to Ortho as an identifiable cause today on exam.

## 2020-02-13 NOTE — Assessment & Plan Note (Signed)
Needs updated labs, denies having any excessive signs or symptoms of hypothyroidism at this time.

## 2020-02-13 NOTE — Assessment & Plan Note (Signed)
Questionable iron deficiency as she continues to report that she has discomfort at times.  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  We will get updated labs just to make sure everything is still doing okay.

## 2020-02-13 NOTE — Patient Instructions (Addendum)
I appreciate the opportunity to provide you with care for your health and wellness. Today we discussed: Several concerns  Follow up: 3 months  Labs today  Referral to urology for overactive bladder, referral to Ortho for knee discomfort.  Start taking omeprazole once daily by mouth on empty stomach. Avoid acidic foods.  Work on Brewing technologist exercises to possibly help with overactive bladder and bladder control.  please continue to practice social distancing to keep you, your family, and our community safe.  If you must go out, please wear a mask and practice good handwashing.  It was a pleasure to see you and I look forward to continuing to work together on your health and well-being. Please do not hesitate to call the office if you need care or have questions about your care.  Have a wonderful day and week. With Gratitude, Cherly Beach, DNP, AGNP-BC    Ejercicios de Kegel Kegel Exercises  Los ejercicios de Kegel pueden ayudar a fortalecer los msculos del suelo plvico. El suelo plvico est formado por un grupo de msculos que sostienen el recto, el intestino delgado y la vejiga. En las mujeres, los msculos del suelo plvico tambin sostienen el tero. Estos msculos ayudan a controlar el flujo de [place] y materia fecal (heces). Los ejercicios de Kegel son indoloros y simples, y no requieren ningn equipo. El mdico puede sugerir ejercicios de Kegel para:  Mejorar el control de la vejiga y los intestinos.  Mejorar la respuesta sexual.  Mejorar los msculos dbiles del suelo plvico despus de una ciruga para extirpar el tero (histerectoma) o de un embarazo (mujeres).  Mejorar los msculos del suelo plvico dbiles despus de la extirpacin o ciruga de la prstata (hombres). Los ejercicios de Kegel consisten en contraer los msculos del suelo plvico, que son los mismos que contrae al intentar detener el flujo de la orina o evitar eliminar gases. Estos ejercicios se  pueden Regulatory affairs officer est sentado, parado o [place], pero lo mejor es variar la posicin. Ejercicios Cmo hacer los ejercicios de Kegel: 1. Contraiga con fuerza los msculos del suelo plvico. Debe sentir que el rea rectal se eleva y se tensa. Si es [place], tambin debe sentir tensin en el rea vaginal. Mantenga el [place], las nalgas y las piernas [place]. 2. Mantenga los msculos tensos durante 10segundos como mximo. 3. Respire con normalidad. 4. Relaje los msculos. 5. Repita como se lo haya indicado el mdico. Repita este ejercicio a diario como se lo haya indicado el mdico. Contine haciendo este ejercicio durante al menos 4 a 6semanas o el tiempo que le haya indicado su mdico. Pueden derivarlo a un fisioterapeuta que lo puede ayudar a aprender ms sobre cmo hacer los ejercicios de Kegel. Segn sea su estado, el mdico puede recomendarle lo siguiente:  Variar cunto [employer].  Hacer varias series de ejercicios [employer].  Hacer ejercicios durante varias semanas.  Convertir los ejercicios de Kegel en parte de su rutina de ejercicios habitual. Esta informacin no tiene Marine scientist el consejo del mdico. Asegrese de hacerle al mdico cualquier pregunta que tenga. Document Revised: 06/08/2018 Document Reviewed: 06/08/2018 Elsevier Patient Education  [place] de reflujo gastroesofgico en los adultos Gastroesophageal Reflux Disease, Adult El reflujo gastroesofgico (RGE) ocurre cuando el cido del estmago sube por el tubo que conecta la boca con el estmago (esfago). Normalmente, la comida baja por el esfago y se mantiene en el estmago, donde se la digiere. Cuando Ardelia Mems  persona tiene RGE, los alimentos y el cido estomacal suelen volver al esfago. Usted puede tener una enfermedad llamada enfermedad de reflujo gastroesofgico (ERGE) si el reflujo:  Sucede a menudo.  Causa sntomas frecuentes o muy  intensos.  Causa problemas tales como dao en el esfago. Cuando esto ocurre, el esfago duele y se hincha (inflama). Con el tiempo, la ERGE puede ocasionar pequeos agujeros (lceras) en el revestimiento del esfago. Cules son las causas? Esta afeccin se debe a un problema en el msculo que se encuentra entre el esfago y [place]. Cuando este msculo est dbil o no es normal, no se cierra correctamente para impedir que los alimentos y el cido regresen del Teaching laboratory technician. El msculo puede debilitarse debido a lo siguiente:  El consumo de [place].  [place].  Tener cierto tipo de hernia (hernia de hiato).  Consumo de alcohol.  Ciertos alimentos y bebidas, como caf, chocolate, cebollas y [place]. Qu incrementa el riesgo? Es ms probable que tenga esta afeccin si:  Tiene sobrepeso.  Tiene una enfermedad que afecta el tejido conjuntivo.  [place] antiinflamatorios no esteroideos (AINE). Cules son los signos o los sntomas? Los sntomas de esta afeccin incluyen:  Acidez estomacal.  Dificultad o dolor al tragar.  Sensacin de Warehouse manager un bulto en la garganta.  Sabor amargo en la boca.  Mal aliento.  Tener una gran cantidad de saliva.  Estmago inflamado o con Dentist.  Eructos.  Dolor en el pecho. El dolor de pecho puede deberse a distintas afecciones. Asegrese de Science writer a su mdico si tiene Journalist, newspaper.  Falta de aire o respiracin ruidosa (sibilancias).  Tos constante (crnica) o durante la noche.  Desgaste de la superficie de los dientes (esmalte dental).  Prdida de peso. Cmo se trata? El tratamiento depender de la gravedad de los sntomas. El mdico puede sugerirle lo siguiente:  Cambios en la dieta.  Medicamentos.  [place]. Siga estas indicaciones en su casa: Comida y bebida   Siga una dieta como se lo haya indicado el mdico. Es posible que deba evitar alimentos y bebidas, por ejemplo: ? Caf y t (con o sin cafena). ? Bebidas que  contengan alcohol. ? Bebidas energticas y deportivas. ? Bebidas gaseosas y refrescos. ? Chocolate y cacao. ? Menta y esencia de [place]. ? Ajo y cebolla. ? [place]bano picante. ? Alimentos cidos y condimentados. Estos incluyen todos los tipos de pimientos, [place] en polvo, curry en polvo, vinagre, salsas picantes y [employer]. ? Ctricos y sus jugos, por ejemplo, naranjas, limones y limas. ? Alimentos que [employer]. Estos incluyen salsa roja, [place], salsa picante y pizza con salsa de [place]. ? Alimentos fritos y Lexicographer. Estos incluyen donas, papas fritas, papitas fritas de bolsa y aderezos con alto contenido de [place] ? Carnes con alto contenido de [place] Estas incluye los perros calientes, chuletas o costillas, embutidos, jamn y tocino. ? Productos lcteos ricos en grasas, como leche [place], [place] y [place] crema.  Consuma pequeas cantidades de comida con ms frecuencia. Evite consumir porciones abundantes.  Evite beber grandes cantidades de lquidos con las comidas.  Evite comer 2 o 3horas antes de acostarse.  Evite recostarse inmediatamente despus de comer.  No haga ejercicios enseguida despus de comer. Estilo de vida   No consuma ningn producto que contenga nicotina o tabaco. Estos incluyen cigarrillos, cigarrillos electrnicos y tabaco para Theatre manager. Si necesita ayuda para dejar de fumar, consulte al [employer].  Intente reducir [employer] de estrs. Si necesita ayuda para hacer esto, consulte  al mdico.  Si tiene sobrepeso, baje una cantidad de peso saludable para usted. Consulte a su mdico para bajar de peso de [employer]. Indicaciones generales  Est atento a cualquier cambio en los sntomas.  Tome los medicamentos de venta libre y los recetados solamente como se lo haya indicado el mdico. No tome aspirina, ibuprofeno ni otros AINE a menos que el mdico lo autorice.  Use ropa holgada. No use nada apretado alrededor de la cintura.  Levante (eleve) la cabecera de  la cama aproximadamente 6pulgadas (15cm).  Evite inclinarse si al hacerlo empeoran los sntomas.  Concurra a todas las visitas de [address] se lo haya indicado el mdico. Esto es importante. Comunquese con un mdico si:  Aparecen nuevos sntomas.  Adelgaza y no sabe por qu.  Tiene problemas para tragar o le duele cuando traga.  Tiene sibilancias o tos persistente.  Los sntomas no mejoran con Scientist, research (medical).  Tiene la voz ronca. Solicite ayuda inmediatamente si:  [employer], el cuello, la [place], los dientes o la espalda.  Se siente transpirado, mareado o tiene una sensacin de desvanecimiento.  Siente falta de aire o Journalist, newspaper.  Vomita y el vmito tiene un aspecto similar a la sangre o a los posos de caf.  Pierde el conocimiento (se desmaya).  Las deposiciones (heces) son sanguinolentas o negras.  No puede tragar, beber o comer. Resumen  Si una persona tiene enfermedad de reflujo gastroesofgico (ERGE), los alimentos y el cido estomacal suben al esfago y causan sntomas o problemas tales como dao en el esfago.  El tratamiento depender de la gravedad de los sntomas.  Siga una Air traffic controller se lo haya indicado el mdico.  Tome todos los medicamentos solamente como se lo haya indicado el mdico. Esta informacin no tiene Theme park manager el consejo del mdico. Asegrese de hacerle al mdico cualquier pregunta que tenga. Document Revised: 04/21/2018 Document Reviewed: 04/21/2018 Elsevier Patient Education  2020 [employer].

## 2020-02-13 NOTE — Assessment & Plan Note (Signed)
Referral to urology urgency not related to urinary tract infection, possibly overactive bladder.

## 2020-02-13 NOTE — Assessment & Plan Note (Signed)
Ordered to make sure there have been no changes in vitamin D level.

## 2020-02-13 NOTE — Assessment & Plan Note (Signed)
Is seeing a group that manages her medications and is unsure of the name.  She reports that she does not have any SI or HI at this time.  Mostly she is upset because she cannot stop her urination problem.  And reports that she would probably feel much better she can get that under control.

## 2020-02-13 NOTE — Assessment & Plan Note (Signed)
Provided with Kegels in [place].  Additionally provided with education about overactive bladder.  Would like to see if Kegels help referral to urology as well to make sure that we are diagnosing the appropriate condition.

## 2020-02-20 ENCOUNTER — Other Ambulatory Visit: Payer: Self-pay | Admitting: *Deleted

## 2020-02-20 MED ORDER — TIOTROPIUM BROMIDE MONOHYDRATE 18 MCG IN CAPS: 18.0000 ug | ORAL_CAPSULE | Freq: Every day | RESPIRATORY_TRACT | 0 refills | Status: DC

## 2020-02-20 MED ORDER — BUDESONIDE-FORMOTEROL FUMARATE 160-4.5 MCG/ACT IN AERO: 2.0000 | INHALATION_SPRAY | Freq: Every day | RESPIRATORY_TRACT | 0 refills | Status: DC

## 2020-02-20 MED ORDER — ALBUTEROL SULFATE HFA 108 (90 BASE) MCG/ACT IN AERS: 1.0000 | INHALATION_SPRAY | Freq: Four times a day (QID) | RESPIRATORY_TRACT | 0 refills | Status: DC | PRN

## 2020-02-26 ENCOUNTER — Ambulatory Visit (INDEPENDENT_AMBULATORY_CARE_PROVIDER_SITE_OTHER): Payer: Medicaid Other | Admitting: Obstetrics & Gynecology

## 2020-02-26 ENCOUNTER — Encounter: Payer: Self-pay | Admitting: Obstetrics & Gynecology

## 2020-02-26 VITALS — BP 117/79 | HR 70 | Ht 61.0 in | Wt 134.0 lb

## 2020-02-26 DIAGNOSIS — K5904 Chronic idiopathic constipation: Secondary | ICD-10-CM | POA: Diagnosis not present

## 2020-02-26 DIAGNOSIS — R102 Pelvic and perineal pain: Secondary | ICD-10-CM | POA: Diagnosis not present

## 2020-02-26 MED ORDER — DOCUSATE SODIUM 100 MG PO CAPS
100.0000 mg | ORAL_CAPSULE | Freq: Two times a day (BID) | ORAL | 11 refills | Status: DC
Start: 2020-02-26 — End: 2020-03-13

## 2020-02-26 NOTE — Progress Notes (Signed)
Chief Complaint  Patient presents with  . Pain in Pelvis, Over Active Bladder    Ovary Check      54 y.o. G6P0 No LMP recorded. Patient has had a hysterectomy. The current method of family planning is status post hysterectomy.  Outpatient Encounter Medications as of 02/26/2020  Medication Sig  . albuterol (VENTOLIN HFA) 108 (90 Base) MCG/ACT inhaler Inhale 1 puff into the lungs every 6 (six) hours as needed for wheezing or shortness of breath. Inhale 1 puff into the lungs every 6 (six) hours as needed for wheezing or shortness of breath.  . budesonide-formoterol (SYMBICORT) 160-4.5 MCG/ACT inhaler Inhale 2 puffs into the lungs daily.  . hydrOXYzine (ATARAX/VISTARIL) 25 MG tablet Take 25 mg by mouth 3 (three) times daily as needed.  . hyoscyamine (ANASPAZ) 0.125 MG TBDP disintergrating tablet Place 0.125-0.25 mg under the tongue every 6 (six) hours as needed for bladder spasms or cramping.  Marland Kitchen levothyroxine (SYNTHROID) 75 MCG tablet Take 75 mcg by mouth daily before breakfast.  . omeprazole (PRILOSEC) 20 MG capsule Take 1 capsule (20 mg total) by mouth daily.  . promethazine (PHENERGAN) 25 MG tablet Take 1 tablet (25 mg total) by mouth every 6 (six) hours as needed for nausea or vomiting.  . rosuvastatin (CRESTOR) 10 MG tablet Take 1 tablet (10 mg total) by mouth daily.  . sertraline (ZOLOFT) 50 MG tablet Take 50 mg by mouth daily.  Marland Kitchen tiotropium (SPIRIVA) 18 MCG inhalation capsule Place 1 capsule (18 mcg total) into inhaler and inhale daily.  . Vitamin D, Ergocalciferol, (DRISDOL) 1.25 MG (50000 UNIT) CAPS capsule Take 1 capsule (50,000 Units total) by mouth every 7 (seven) days.  Marland Kitchen docusate sodium (COLACE) 100 MG capsule Take 1 capsule (100 mg total) by mouth 2 (two) times daily.   No facility-administered encounter medications on file as of 02/26/2020.    Subjective Pt states she has 1-2 episodes per month of lower pelvic pain Sudden onset stabbing  Severe in nature Last 15  mintues or so Feels like she need to push something out asscoiated cold sweating also S/p hysterectomy 2009, supracervical No bleeding Past Medical History:  Diagnosis Date  . Asbestos exposure   . Encounter for screening for malignant neoplasm of colon 12/07/2019  . Family history of diabetes mellitus 12/07/2019  . Family history of hyperlipidemia 12/07/2019  . Generalized headaches 12/07/2019  . Neck and shoulder pain 12/07/2019  . Shortness of breath 12/07/2019  . Thyroid disease   . Upper back pain 12/07/2019    Past Surgical History:  Procedure Laterality Date  . ABDOMINAL HYSTERECTOMY      OB History    Gravida  6   Para      Term      Preterm      AB      Living  6     SAB      TAB      Ectopic      Multiple      Live Births  6           No Known Allergies  Social History   Socioeconomic History  . Marital status: Legally Separated    Spouse name: Not on file  . Number of children: 6  . Years of education: Not on file  . Highest education level: 1st grade  Occupational History  . Not on file  Tobacco Use  . Smoking status: Never Smoker  . Smokeless tobacco: Never  Used  Substance and Sexual Activity  . Alcohol use: Never  . Drug use: Never  . Sexual activity: Yes    Birth control/protection: Condom, Surgical  Other Topics Concern  . Not on file  Social History Narrative   Moved from [place] at 82 years old   82 children   Lives with daughter -Stanton Kidney and Salley Scarlet' and her two children       Enjoys working around the house      Diet: eats all food groups   Caffeine: coffee 2-3 cups   Water: 4-6 cups daily      Wears seat belt    Does not drive, but can.   Smoke detectors at home           Social Determinants of Health   Financial Resource Strain: Low Risk   . Difficulty of Paying Living Expenses: Not hard at all  Food Insecurity: No Food Insecurity  . Worried About Charity fundraiser in the Last Year: Never true  . Ran Out of  Food in the Last Year: Never true  Transportation Needs: No Transportation Needs  . Lack of Transportation (Medical): No  . Lack of Transportation (Non-Medical): No  Physical Activity: Inactive  . Days of Exercise per Week: 0 days  . Minutes of Exercise per Session: 0 min  Stress: No Stress Concern Present  . Feeling of Stress : Only a little  Social Connections: Moderately Isolated  . Frequency of Communication with Friends and Family: More than three times a week  . Frequency of Social Gatherings with Friends and Family: More than three times a week  . Attends Religious Services: Never  . Active Member of Clubs or Organizations: No  . Attends Archivist Meetings: Never  . Marital Status: Separated    Family History  Problem Relation Age of Onset  . Cancer Mother        stomach cancer  . Cancer Brother        stomach cancer    Medications:       Current Outpatient Medications:  .  albuterol (VENTOLIN HFA) 108 (90 Base) MCG/ACT inhaler, Inhale 1 puff into the lungs every 6 (six) hours as needed for wheezing or shortness of breath. Inhale 1 puff into the lungs every 6 (six) hours as needed for wheezing or shortness of breath., Disp: 18 g, Rfl: 0 .  budesonide-formoterol (SYMBICORT) 160-4.5 MCG/ACT inhaler, Inhale 2 puffs into the lungs daily., Disp: 1 Inhaler, Rfl: 0 .  hydrOXYzine (ATARAX/VISTARIL) 25 MG tablet, Take 25 mg by mouth 3 (three) times daily as needed., Disp: , Rfl:  .  hyoscyamine (ANASPAZ) 0.125 MG TBDP disintergrating tablet, Place 0.125-0.25 mg under the tongue every 6 (six) hours as needed for bladder spasms or cramping., Disp: , Rfl:  .  levothyroxine (SYNTHROID) 75 MCG tablet, Take 75 mcg by mouth daily before breakfast., Disp: , Rfl:  .  omeprazole (PRILOSEC) 20 MG capsule, Take 1 capsule (20 mg total) by mouth daily., Disp: 30 capsule, Rfl: 3 .  promethazine (PHENERGAN) 25 MG tablet, Take 1 tablet (25 mg total) by mouth every 6 (six) hours as needed  for nausea or vomiting., Disp: 30 tablet, Rfl: 0 .  rosuvastatin (CRESTOR) 10 MG tablet, Take 1 tablet (10 mg total) by mouth daily., Disp: 90 tablet, Rfl: 0 .  sertraline (ZOLOFT) 50 MG tablet, Take 50 mg by mouth daily., Disp: , Rfl:  .  tiotropium (SPIRIVA) 18 MCG inhalation capsule, Place 1  capsule (18 mcg total) into inhaler and inhale daily., Disp: 30 capsule, Rfl: 0 .  Vitamin D, Ergocalciferol, (DRISDOL) 1.25 MG (50000 UNIT) CAPS capsule, Take 1 capsule (50,000 Units total) by mouth every 7 (seven) days., Disp: 12 capsule, Rfl: 1 .  docusate sodium (COLACE) 100 MG capsule, Take 1 capsule (100 mg total) by mouth 2 (two) times daily., Disp: 60 capsule, Rfl: 11  Objective Blood pressure 117/79, pulse 70, height 5\' 1"  (1.549 m), weight 134 lb (60.8 kg).  General WDWN female NAD Vulva:  normal appearing vulva with no masses, tenderness or lesions Vagina:  normal mucosa, no discharge Cervix:  Normal no lesions Uterus:  Surgically absent Adnexa: ovaries:present,  normal adnexa in size, nontender and no masses   Pertinent ROS No burning with urination, frequency or urgency No nausea, vomiting or diarrhea Nor fever chills or other constitutional symptoms   Labs or studies     Impression Diagnoses this Encounter::   ICD-10-CM   1. Pelvic pain  R10.2 [id number] PELVIS (TRANSABDOMINAL ONLY)    US PELVIS TRANSVAGINAL NON-OB (TV ONLY)   clincially appears to be GI source, recommend colace. Pt concerned about her ovaries even though on exam normal she desires definitive eval with sonogram  2. Chronic idiopathic constipation  K59.04     Established relevant diagnosis(es):   Plan/Recommendations: Meds ordered this encounter  Medications  . docusate sodium (COLACE) 100 MG capsule    Sig: Take 1 capsule (100 mg total) by mouth 2 (two) times daily.    Dispense:  60 capsule    Refill:  11    Labs or Scans Ordered: Orders Placed This Encounter  Procedures  . US PELVIS (TRANSABDOMINAL  ONLY)  . US PELVIS TRANSVAGINAL NON-OB (TV ONLY)    Management:: See plan above and follow up  Follow up Return in about 3 weeks (around 03/18/2020) for GYN sono, Follow up, with Dr 03/20/2020.       All questions were answered.

## 2020-02-29 ENCOUNTER — Ambulatory Visit: Payer: Medicaid Other

## 2020-02-29 ENCOUNTER — Ambulatory Visit: Payer: Medicaid Other | Admitting: Orthopaedic Surgery

## 2020-02-29 ENCOUNTER — Other Ambulatory Visit: Payer: Self-pay

## 2020-02-29 ENCOUNTER — Encounter: Payer: Self-pay | Admitting: Orthopaedic Surgery

## 2020-02-29 VITALS — BP 120/90 | HR 80 | Ht 61.0 in | Wt 134.0 lb

## 2020-02-29 DIAGNOSIS — M545 Low back pain: Secondary | ICD-10-CM

## 2020-02-29 DIAGNOSIS — M79605 Pain in left leg: Secondary | ICD-10-CM

## 2020-02-29 DIAGNOSIS — M5442 Lumbago with sciatica, left side: Secondary | ICD-10-CM

## 2020-02-29 NOTE — Progress Notes (Signed)
Subjective:    Patient ID: Jennifer Wright, female    DOB: 07-Nov-1965, 54 y.o.   MRN: [id number]  HPI She is accompanied by translator.  She says she has over two year history of constant pain in the lower back with some radiation to the left side and hip to the knee.  She has no trauma.  She has no weakness. She has seen [facility] on 02-09-2020 and I have reviewed the notes.  Patient has tried ibuprofen and ice with little help.   She has no bowel or bladder problems.  She has pain bending and after laying down a while.     Review of Systems  Constitutional: Positive for activity change.  Respiratory: Positive for shortness of breath.   Musculoskeletal: Positive for arthralgias and back pain.  All other systems reviewed and are negative.  For Review of Systems, all other systems reviewed and are negative.  The following is a summary of the past history medically, past history surgically, known current medicines, social history and family history.  This information is gathered electronically by the computer from prior information and documentation.  I review this each visit and have found including this information at this point in the chart is beneficial and informative.   Past Medical History:  Diagnosis Date  . Asbestos exposure   . Encounter for screening for malignant neoplasm of colon 12/07/2019  . Family history of diabetes mellitus 12/07/2019  . Family history of hyperlipidemia 12/07/2019  . Generalized headaches 12/07/2019  . Neck and shoulder pain 12/07/2019  . Shortness of breath 12/07/2019  . Thyroid disease   . Upper back pain 12/07/2019    Past Surgical History:  Procedure Laterality Date  . ABDOMINAL HYSTERECTOMY      Current Outpatient Medications on File Prior to Visit  Medication Sig Dispense Refill  . albuterol (VENTOLIN HFA) 108 (90 Base) MCG/ACT inhaler Inhale 1 puff into the lungs every 6 (six) hours as needed for wheezing or shortness of breath.  Inhale 1 puff into the lungs every 6 (six) hours as needed for wheezing or shortness of breath. 18 g 0  . budesonide-formoterol (SYMBICORT) 160-4.5 MCG/ACT inhaler Inhale 2 puffs into the lungs daily. 1 Inhaler 0  . docusate sodium (COLACE) 100 MG capsule Take 1 capsule (100 mg total) by mouth 2 (two) times daily. 60 capsule 11  . hydrOXYzine (ATARAX/VISTARIL) 25 MG tablet Take 25 mg by mouth 3 (three) times daily as needed.    . hyoscyamine (ANASPAZ) 0.125 MG TBDP disintergrating tablet Place 0.125-0.25 mg under the tongue every 6 (six) hours as needed for bladder spasms or cramping.    Marland Kitchen levothyroxine (SYNTHROID) 75 MCG tablet Take 75 mcg by mouth daily before breakfast.    . omeprazole (PRILOSEC) 20 MG capsule Take 1 capsule (20 mg total) by mouth daily. 30 capsule 3  . promethazine (PHENERGAN) 25 MG tablet Take 1 tablet (25 mg total) by mouth every 6 (six) hours as needed for nausea or vomiting. 30 tablet 0  . rosuvastatin (CRESTOR) 10 MG tablet Take 1 tablet (10 mg total) by mouth daily. 90 tablet 0  . sertraline (ZOLOFT) 50 MG tablet Take 50 mg by mouth daily.    Marland Kitchen tiotropium (SPIRIVA) 18 MCG inhalation capsule Place 1 capsule (18 mcg total) into inhaler and inhale daily. 30 capsule 0  . Vitamin D, Ergocalciferol, (DRISDOL) 1.25 MG (50000 UNIT) CAPS capsule Take 1 capsule (50,000 Units total) by mouth every 7 (seven) days.  12 capsule 1   No current facility-administered medications on file prior to visit.    Social History   Socioeconomic History  . Marital status: Legally Separated    Spouse name: Not on file  . Number of children: 6  . Years of education: Not on file  . Highest education level: 1st grade  Occupational History  . Not on file  Tobacco Use  . Smoking status: Never Smoker  . Smokeless tobacco: Never Used  Vaping Use  . Vaping Use: Never used  Substance and Sexual Activity  . Alcohol use: Never  . Drug use: Never  . Sexual activity: Yes    Birth  control/protection: Condom, Surgical  Other Topics Concern  . Not on file  Social History Narrative   Moved from [place] at 50 years old   6 children   Lives with daughter -Corrie Dandy and Rolinda Roan' and her two children       Enjoys working around the house      Diet: eats all food groups   Caffeine: coffee 2-3 cups   Water: 4-6 cups daily      Wears seat belt    Does not drive, but can.   Smoke detectors at home           Social Determinants of Health   Financial Resource Strain: Low Risk   . Difficulty of Paying Living Expenses: Not hard at all  Food Insecurity: No Food Insecurity  . Worried About Programme researcher, broadcasting/film/video in the Last Year: Never true  . Ran Out of Food in the Last Year: Never true  Transportation Needs: No Transportation Needs  . Lack of Transportation (Medical): No  . Lack of Transportation (Non-Medical): No  Physical Activity: Inactive  . Days of Exercise per Week: 0 days  . Minutes of Exercise per Session: 0 min  Stress: No Stress Concern Present  . Feeling of Stress : Only a little  Social Connections: Socially Isolated  . Frequency of Communication with Friends and Family: More than three times a week  . Frequency of Social Gatherings with Friends and Family: More than three times a week  . Attends Religious Services: Never  . Active Member of Clubs or Organizations: No  . Attends Banker Meetings: Never  . Marital Status: Separated  Intimate Partner Violence: Not At Risk  . Fear of Current or Ex-Partner: No  . Emotionally Abused: No  . Physically Abused: No  . Sexually Abused: No    Family History  Problem Relation Age of Onset  . Cancer Mother        stomach cancer  . Cancer Brother        stomach cancer    BP 120/90   Pulse 80   Ht 5\' 1"  (1.549 m)   Wt 134 lb (60.8 kg)   BMI 25.32 kg/m   Body mass index is 25.32 kg/m.     Objective:   Physical Exam Vitals and nursing note reviewed.  Constitutional:      Appearance:  She is well-developed.  HENT:     Head: Normocephalic and atraumatic.  Eyes:     Conjunctiva/sclera: Conjunctivae normal.     Pupils: Pupils are equal, round, and reactive to light.  Cardiovascular:     Rate and Rhythm: Normal rate and regular rhythm.  Pulmonary:     Effort: Pulmonary effort is normal.  Abdominal:     Palpations: Abdomen is soft.  Musculoskeletal:  Arms:     Cervical back: Normal range of motion and neck supple.  Skin:    General: Skin is warm and dry.  Neurological:     Mental Status: She is alert and oriented to person, place, and time.     Cranial Nerves: No cranial nerve deficit.     Motor: No abnormal muscle tone.     Coordination: Coordination normal.     Deep Tendon Reflexes: Reflexes are normal and symmetric. Reflexes normal.  Psychiatric:        Behavior: Behavior normal.        Thought Content: Thought content normal.        Judgment: Judgment normal.   She has left ankle monitor in place.   X-rays were done of the lumbar spine, reported separately.     Assessment & Plan:   Encounter Diagnosis  Name Primary?  . Lumbar pain with radiation down left leg Yes   She has liver disease and GI reflux which is marked.  I cannot give NSAIDs.  I will consider MRI in one month.  She has to wait a length of time according to her insurance.  Return in one month.  Continue present medicines.  Call if any problem.  Precautions discussed.   Electronically Signed Darreld Mclean, MD 6/10/20219:09 AM

## 2020-03-08 ENCOUNTER — Ambulatory Visit: Payer: Medicaid Other | Admitting: Family Medicine

## 2020-03-13 ENCOUNTER — Ambulatory Visit (INDEPENDENT_AMBULATORY_CARE_PROVIDER_SITE_OTHER): Payer: Medicaid Other | Admitting: Family Medicine

## 2020-03-13 ENCOUNTER — Encounter: Payer: Self-pay | Admitting: Family Medicine

## 2020-03-13 ENCOUNTER — Other Ambulatory Visit: Payer: Self-pay

## 2020-03-13 VITALS — BP 122/77 | HR 69 | Temp 97.0°F | Ht 61.0 in | Wt 137.4 lb

## 2020-03-13 DIAGNOSIS — G43009 Migraine without aura, not intractable, without status migrainosus: Secondary | ICD-10-CM

## 2020-03-13 DIAGNOSIS — E559 Vitamin D deficiency, unspecified: Secondary | ICD-10-CM | POA: Diagnosis not present

## 2020-03-13 DIAGNOSIS — M25562 Pain in left knee: Secondary | ICD-10-CM

## 2020-03-13 DIAGNOSIS — N3281 Overactive bladder: Secondary | ICD-10-CM

## 2020-03-13 DIAGNOSIS — F321 Major depressive disorder, single episode, moderate: Secondary | ICD-10-CM

## 2020-03-13 DIAGNOSIS — E039 Hypothyroidism, unspecified: Secondary | ICD-10-CM

## 2020-03-13 DIAGNOSIS — M5416 Radiculopathy, lumbar region: Secondary | ICD-10-CM

## 2020-03-13 DIAGNOSIS — K219 Gastro-esophageal reflux disease without esophagitis: Secondary | ICD-10-CM

## 2020-03-13 DIAGNOSIS — M5442 Lumbago with sciatica, left side: Secondary | ICD-10-CM | POA: Insufficient documentation

## 2020-03-13 MED ORDER — SERTRALINE HCL 100 MG PO TABS: 100.0000 mg | ORAL_TABLET | Freq: Every day | ORAL | 3 refills | Status: DC

## 2020-03-13 MED ORDER — OMEPRAZOLE 20 MG PO CPDR: 20.0000 mg | DELAYED_RELEASE_CAPSULE | Freq: Two times a day (BID) | ORAL | 2 refills | Status: DC

## 2020-03-13 MED ORDER — SUMATRIPTAN SUCCINATE 100 MG PO TABS: 100.0000 mg | ORAL_TABLET | ORAL | 0 refills | Status: DC | PRN

## 2020-03-13 MED ORDER — VITAMIN D (ERGOCALCIFEROL) 1.25 MG (50000 UNIT) PO CAPS: 50000.0000 [IU] | ORAL_CAPSULE | ORAL | 1 refills | Status: DC

## 2020-03-13 MED ORDER — LEVOTHYROXINE SODIUM 75 MCG PO TABS: 75.0000 ug | ORAL_TABLET | Freq: Every day | ORAL | 0 refills | Status: DC

## 2020-03-13 MED ORDER — HYOSCYAMINE SULFATE 0.125 MG PO TBDP: 0.1250 mg | ORAL_TABLET | Freq: Four times a day (QID) | ORAL | 1 refills | Status: DC | PRN

## 2020-03-13 NOTE — Assessment & Plan Note (Signed)
I have encouraged her to use [facility]eg[facility].  However that does not appear to be doing much good.  She is not taking any of the antispasm medication will do a refill of this.  And possible referral to urology if not improved.

## 2020-03-13 NOTE — Progress Notes (Signed)
Subjective:  Patient ID: Jennifer Wright, female    DOB: 1965-12-07  Age: 54 y.o. MRN: [id number]  CC:  Chief Complaint  Patient presents with  . Follow-up  . Back Pain      HPI  HPI   Jennifer Wright is a 54 year old female patient of mine who presents today.  She is predominantly Spanish-speaking and requires an interpreter which is here.  I saw her back in May for several concerns much of which were addressed at that time.  Today we follow-up and she reports that she still having knee pain and back pain.  Additionally she is still feeling a little bit sick on her stomach with some nausea no vomiting.  And she is having migraines.  As well as her depression has not improved.  She reports taking all her medications as directed.  And without any issue.  She has ran out of her migraine medication which was Imitrex and just wants a refill for this because she reports that that helps tremendously for her.  She has been taking the Prilosec which helped her a little bit with her nausea but she reports that she still has a little discomfort.  She had elevated liver enzymes on her last check that had improved from previous checks but possible fatty liver which also could be combined with peptic and or some other type of indigestion reflux issue.  She is willing to have me adjust her medication to twice daily and refer to GI.  Knee Pain: Left knee pain keeps hurting not hurting right now but gives her a fit sometimes.  She has not had any injury to it.  Please see previous note for more details. Back Pain: Is being followed by Dr Luna Glasgow now. lower back pain dull pain stays constant doesnt let her sleep at night right now pain is at a 6 ortho is addressing this and has ordered an MRI  She denies having any SI or HI.  However she just reports that she is sad and down.  She is willing to having increasing her medications to see if that will help with her depression.  Of note she does report that she did not  have any of the medication for her bladder spasms.  So she reported that symptoms had not improved refill will be provided today.  She denies having any chest pain, shortness of breath, cough, leg swelling, headaches outside of her normal migraines, vision changes or dizziness.  Today patient denies signs and symptoms of COVID 19 infection including fever, chills, cough, shortness of breath, and headache. Past Medical, Surgical, Social History, Allergies, and Medications have been Reviewed.   Past Medical History:  Diagnosis Date  . Asbestos exposure   . Encounter for screening for malignant neoplasm of colon 12/07/2019  . Family history of diabetes mellitus 12/07/2019  . Family history of hyperlipidemia 12/07/2019  . Generalized headaches 12/07/2019  . Neck and shoulder pain 12/07/2019  . Shortness of breath 12/07/2019  . Thyroid disease   . Upper back pain 12/07/2019    Current Meds  Medication Sig  . albuterol (VENTOLIN HFA) 108 (90 Base) MCG/ACT inhaler Inhale 1 puff into the lungs every 6 (six) hours as needed for wheezing or shortness of breath. Inhale 1 puff into the lungs every 6 (six) hours as needed for wheezing or shortness of breath.  . budesonide-formoterol (SYMBICORT) 160-4.5 MCG/ACT inhaler Inhale 2 puffs into the lungs daily.  . hydrOXYzine (ATARAX/VISTARIL) 25 MG tablet  Take 25 mg by mouth 3 (three) times daily as needed.  Marland Kitchen levothyroxine (SYNTHROID) 75 MCG tablet Take 1 tablet (75 mcg total) by mouth daily before breakfast.  . omeprazole (PRILOSEC) 20 MG capsule Take 1 capsule (20 mg total) by mouth 2 (two) times daily before a meal.  . promethazine (PHENERGAN) 25 MG tablet Take 1 tablet (25 mg total) by mouth every 6 (six) hours as needed for nausea or vomiting.  . rosuvastatin (CRESTOR) 10 MG tablet Take 1 tablet (10 mg total) by mouth daily.  . sertraline (ZOLOFT) 100 MG tablet Take 1 tablet (100 mg total) by mouth daily.  Marland Kitchen tiotropium (SPIRIVA) 18 MCG inhalation capsule  Place 1 capsule (18 mcg total) into inhaler and inhale daily.  . Vitamin D, Ergocalciferol, (DRISDOL) 1.25 MG (50000 UNIT) CAPS capsule Take 1 capsule (50,000 Units total) by mouth every 7 (seven) days.  . [DISCONTINUED] levothyroxine (SYNTHROID) 75 MCG tablet Take 75 mcg by mouth daily before breakfast.  . [DISCONTINUED] omeprazole (PRILOSEC) 20 MG capsule Take 1 capsule (20 mg total) by mouth daily.  . [DISCONTINUED] sertraline (ZOLOFT) 50 MG tablet Take 50 mg by mouth daily.  . [DISCONTINUED] Vitamin D, Ergocalciferol, (DRISDOL) 1.25 MG (50000 UNIT) CAPS capsule Take 1 capsule (50,000 Units total) by mouth every 7 (seven) days.    ROS:  Review of Systems  Constitutional: Negative.   HENT: Negative.   Eyes: Negative.   Respiratory: Negative.   Cardiovascular: Negative.   Gastrointestinal: Negative.   Genitourinary: Negative.   Musculoskeletal: Positive for back pain and joint pain.  Skin: Negative.   Neurological: Negative.   Endo/Heme/Allergies: Negative.   Psychiatric/Behavioral: Negative.   All other systems reviewed and are negative.  See HPI for more detail  Objective:   Today's Vitals: BP 122/77 (BP Location: Right Arm, Patient Position: Sitting, Cuff Size: Normal)   Pulse 69   Temp (!) 97 F (36.1 C) (Temporal)   Ht 5\' 1"  (1.549 m)   Wt 137 lb 6.4 oz (62.3 kg)   SpO2 96%   BMI 25.96 kg/m  Vitals with BMI 03/13/2020 02/29/2020 02/26/2020  Height 5\' 1"  5\' 1"  5\' 1"   Weight 137 lbs 6 oz 134 lbs 134 lbs  BMI 25.97 25.33 25.33  Systolic 122 120 04/27/2020  Diastolic 77 90 79  Pulse 69 80 70     Physical Exam Vitals and nursing note reviewed.  Constitutional:      Appearance: Normal appearance. She is well-developed, well-groomed and overweight.  HENT:     Head: Normocephalic and atraumatic.     Right Ear: External ear normal.     Left Ear: External ear normal.     Mouth/Throat:     Comments: Mask in place Eyes:     General:        Right eye: No discharge.         Left eye: No discharge.     Conjunctiva/sclera: Conjunctivae normal.  Cardiovascular:     Rate and Rhythm: Normal rate and regular rhythm.     Pulses: Normal pulses.     Heart sounds: Normal heart sounds.  Pulmonary:     Effort: Pulmonary effort is normal.     Breath sounds: Normal breath sounds.  Musculoskeletal:     Cervical back: Normal range of motion and neck supple.  Skin:    General: Skin is warm.  Neurological:     General: No focal deficit present.     Mental Status: She is alert and oriented  to person, place, and time.  Psychiatric:        Mood and Affect: Mood normal.        Behavior: Behavior normal. Behavior is cooperative.        Thought Content: Thought content normal.        Judgment: Judgment normal.      Assessment   1. Depression, major, single episode, moderate (HCC)   2. Lumbar back pain with radiculopathy affecting left lower extremity   3. Acute pain of left knee   4. Vitamin D deficiency   5. Hypothyroidism, unspecified type   6. Overactive bladder   7. Migraine without aura and without status migrainosus, not intractable   8. Gastroesophageal reflux disease, unspecified whether esophagitis present     Tests ordered No orders of the defined types were placed in this encounter.    Plan: Please see assessment and plan per problem list above.   Meds ordered this encounter  Medications  . sertraline (ZOLOFT) 100 MG tablet    Sig: Take 1 tablet (100 mg total) by mouth daily.    Dispense:  30 tablet    Refill:  3    Order Specific Question:   Supervising Provider    Answer:   SIMPSON, MARGARET E [[id number]]  . Vitamin D, Ergocalciferol, (DRISDOL) 1.25 MG (50000 UNIT) CAPS capsule    Sig: Take 1 capsule (50,000 Units total) by mouth every 7 (seven) days.    Dispense:  12 capsule    Refill:  1    Order Specific Question:   Supervising Provider    Answer:   SIMPSON, MARGARET E [[id number]]  . levothyroxine (SYNTHROID) 75 MCG tablet    Sig: Take 1  tablet (75 mcg total) by mouth daily before breakfast.    Dispense:  90 tablet    Refill:  0    Order Specific Question:   Supervising Provider    Answer:   SIMPSON, MARGARET E [[id number]]  . hyoscyamine (ANASPAZ) 0.125 MG TBDP disintergrating tablet    Sig: Place 1-2 tablets (0.125-0.25 mg total) under the tongue every 6 (six) hours as needed for bladder spasms or cramping.    Dispense:  30 tablet    Refill:  1    Order Specific Question:   Supervising Provider    Answer:   SIMPSON, MARGARET E [[id number]]  . SUMAtriptan (IMITREX) 100 MG tablet    Sig: Take 1 tablet (100 mg total) by mouth every 2 (two) hours as needed for migraine. May repeat in 2 hours if headache persists or recurs.    Dispense:  10 tablet    Refill:  0    Order Specific Question:   Supervising Provider    Answer:   SIMPSON, MARGARET E [[id number]]  . omeprazole (PRILOSEC) 20 MG capsule    Sig: Take 1 capsule (20 mg total) by mouth 2 (two) times daily before a meal.    Dispense:  60 capsule    Refill:  2    Order Specific Question:   Supervising Provider    Answer:   Genia Harold    Patient to follow-up in 05/08/2020  Freddy Finner, NP

## 2020-03-13 NOTE — Assessment & Plan Note (Signed)
I have referred her to Ortho.  And she is being followed by them now.

## 2020-03-13 NOTE — Assessment & Plan Note (Signed)
Signs and symptoms are consistent with uncontrolled GERD.  Will increase Prilosec to twice daily.  Referral to GI.

## 2020-03-13 NOTE — Assessment & Plan Note (Signed)
Refill of medication provided today. Denies having any signs or symptoms of hypothyroidism.

## 2020-03-13 NOTE — Assessment & Plan Note (Signed)
She was seeing a group unsure of the name.  Questionable concern that her depression is not being treated.  She denies having any SI or HI at this time.  She is not as tearful as she was at the last visit.  But will increase Zoloft 200 mg daily.  And follow-up in 3 months.  Advised for her to call back if she reports that she is feeling poorly or not improved.

## 2020-03-13 NOTE — Assessment & Plan Note (Signed)
Refill medication provided.  Will check level at next visit.

## 2020-03-13 NOTE — Patient Instructions (Signed)
I appreciate the opportunity to provide you with care for your health and wellness. Today we discussed: refill of several meds  Follow up: 8 weeks depression   No labs  Referrals today: GI to go ahead and check out stomach   Please review the medication changes.  Please continue to practice social distancing to keep you, your family, and our community safe.  If you must go out, please wear a mask and practice good handwashing.  It was a pleasure to see you and I look forward to continuing to work together on your health and well-being. Please do not hesitate to call the office if you need care or have questions about your care.  Have a wonderful day and week. With Gratitude, Tereasa Coop, DNP, AGNP-BC

## 2020-03-13 NOTE — Assessment & Plan Note (Signed)
She is being followed by Dr. Hilda Lias now.  He is getting an MRI here soon.  No medications provided encouraged her to do the treatments that he has implemented.

## 2020-03-14 ENCOUNTER — Ambulatory Visit: Payer: Medicaid Other | Admitting: Orthopaedic Surgery

## 2020-03-18 ENCOUNTER — Ambulatory Visit: Payer: Medicaid Other | Admitting: Obstetrics & Gynecology

## 2020-03-18 ENCOUNTER — Other Ambulatory Visit: Payer: Medicaid Other

## 2020-03-19 ENCOUNTER — Encounter: Payer: Self-pay | Admitting: Internal Medicine

## 2020-03-28 ENCOUNTER — Encounter: Payer: Self-pay | Admitting: Orthopaedic Surgery

## 2020-03-28 ENCOUNTER — Other Ambulatory Visit: Payer: Self-pay

## 2020-03-28 ENCOUNTER — Ambulatory Visit (INDEPENDENT_AMBULATORY_CARE_PROVIDER_SITE_OTHER): Payer: Medicaid Other | Admitting: Orthopaedic Surgery

## 2020-03-28 VITALS — BP 106/75 | HR 74 | Ht 61.0 in | Wt 135.4 lb

## 2020-03-28 DIAGNOSIS — M79605 Pain in left leg: Secondary | ICD-10-CM

## 2020-03-28 DIAGNOSIS — M545 Low back pain: Secondary | ICD-10-CM

## 2020-03-28 MED ORDER — HYDROCODONE-ACETAMINOPHEN 5-325 MG PO TABS: ORAL_TABLET | ORAL | 0 refills | Status: DC

## 2020-03-28 MED ORDER — NAPROXEN 500 MG PO TABS: 500.0000 mg | ORAL_TABLET | Freq: Two times a day (BID) | ORAL | 5 refills | Status: DC

## 2020-03-28 NOTE — Progress Notes (Signed)
Patient Jennifer Wright, female DOB:1965-12-01, 54 y.o. BPZ:[id number]  Chief Complaint  Patient presents with  . Back Pain    HPI  Jennifer Wright is a 54 y.o. female who has continued pain of the lower back with pain radiating down the left posterior thigh to the foot.  She has no trauma.  Nothing seems to help.  I will get MRI.  I am concerned about HNP.  Translator was with her today.  Body mass index is 25.58 kg/m.  ROS  Review of Systems  Constitutional: Positive for activity change.  Respiratory: Positive for shortness of breath.   Musculoskeletal: Positive for arthralgias and back pain.  All other systems reviewed and are negative.   All other systems reviewed and are negative.  The following is a summary of the past history medically, past history surgically, known current medicines, social history and family history.  This information is gathered electronically by the computer from prior information and documentation.  I review this each visit and have found including this information at this point in the chart is beneficial and informative.    Past Medical History:  Diagnosis Date  . Asbestos exposure   . Encounter for screening for malignant neoplasm of colon 12/07/2019  . Family history of diabetes mellitus 12/07/2019  . Family history of hyperlipidemia 12/07/2019  . Generalized headaches 12/07/2019  . Neck and shoulder pain 12/07/2019  . Shortness of breath 12/07/2019  . Thyroid disease   . Upper back pain 12/07/2019    Past Surgical History:  Procedure Laterality Date  . ABDOMINAL HYSTERECTOMY      Family History  Problem Relation Age of Onset  . Cancer Mother        stomach cancer  . Cancer Brother        stomach cancer    Social History Social History   Tobacco Use  . Smoking status: Never Smoker  . Smokeless tobacco: Never Used  Vaping Use  . Vaping Use: Never used  Substance Use Topics  . Alcohol use: Never  . Drug use: Never    No Known  Allergies  Current Outpatient Medications  Medication Sig Dispense Refill  . albuterol (VENTOLIN HFA) 108 (90 Base) MCG/ACT inhaler Inhale 1 puff into the lungs every 6 (six) hours as needed for wheezing or shortness of breath. Inhale 1 puff into the lungs every 6 (six) hours as needed for wheezing or shortness of breath. 18 g 0  . budesonide-formoterol (SYMBICORT) 160-4.5 MCG/ACT inhaler Inhale 2 puffs into the lungs daily. 1 Inhaler 0  . hydrOXYzine (ATARAX/VISTARIL) 25 MG tablet Take 25 mg by mouth 3 (three) times daily as needed.    . hyoscyamine (ANASPAZ) 0.125 MG TBDP disintergrating tablet Place 1-2 tablets (0.125-0.25 mg total) under the tongue every 6 (six) hours as needed for bladder spasms or cramping. 30 tablet 1  . levothyroxine (SYNTHROID) 75 MCG tablet Take 1 tablet (75 mcg total) by mouth daily before breakfast. 90 tablet 0  . omeprazole (PRILOSEC) 20 MG capsule Take 1 capsule (20 mg total) by mouth 2 (two) times daily before a meal. 60 capsule 2  . promethazine (PHENERGAN) 25 MG tablet Take 1 tablet (25 mg total) by mouth every 6 (six) hours as needed for nausea or vomiting. 30 tablet 0  . rosuvastatin (CRESTOR) 10 MG tablet Take 1 tablet (10 mg total) by mouth daily. 90 tablet 0  . sertraline (ZOLOFT) 100 MG tablet Take 1 tablet (100 mg total) by mouth daily.  30 tablet 3  . SUMAtriptan (IMITREX) 100 MG tablet Take 1 tablet (100 mg total) by mouth every 2 (two) hours as needed for migraine. May repeat in 2 hours if headache persists or recurs. 10 tablet 0  . tiotropium (SPIRIVA) 18 MCG inhalation capsule Place 1 capsule (18 mcg total) into inhaler and inhale daily. 30 capsule 0  . Vitamin D, Ergocalciferol, (DRISDOL) 1.25 MG (50000 UNIT) CAPS capsule Take 1 capsule (50,000 Units total) by mouth every 7 (seven) days. 12 capsule 1  . HYDROcodone-acetaminophen (NORCO/VICODIN) 5-325 MG tablet One tablet every four hours for pain. 30 tablet 0  . naproxen (NAPROSYN) 500 MG tablet Take 1  tablet (500 mg total) by mouth 2 (two) times daily with a meal. 60 tablet 5   No current facility-administered medications for this visit.     Physical Exam  Blood pressure 106/75, pulse 74, height 5\' 1"  (1.549 m), weight 135 lb 6.4 oz (61.4 kg).  Constitutional: overall normal hygiene, normal nutrition, well developed, normal grooming, normal body habitus. Assistive device:none  Musculoskeletal: gait and station Limp none, muscle tone and strength are normal, no tremors or atrophy is present.  .  Neurological: coordination overall normal.  Deep tendon reflex/nerve stretch intact.  Sensation normal.  Cranial nerves II-XII intact.   Skin:   Normal overall no scars, lesions, ulcers or rashes. No psoriasis.  Psychiatric: Alert and oriented x 3.  Recent memory intact, remote memory unclear.  Normal mood and affect. Well groomed.  Good eye contact.  Cardiovascular: overall no swelling, no varicosities, no edema bilaterally, normal temperatures of the legs and arms, no clubbing, cyanosis and good capillary refill.  Lymphatic: palpation is normal.  Spine/Pelvis examination:  Inspection:  Overall, sacoiliac joint benign and hips nontender; without crepitus or defects.   Thoracic spine inspection: Alignment normal without kyphosis present   Lumbar spine inspection:  Alignment  with normal lumbar lordosis, without scoliosis apparent.   Thoracic spine palpation:  without tenderness of spinal processes   Lumbar spine palpation: without tenderness of lumbar area; without tightness of lumbar muscles    Range of Motion:   Lumbar flexion, forward flexion is normal without pain or tenderness    Lumbar extension is full without pain or tenderness   Left lateral bend is normal without pain or tenderness   Right lateral bend is normal without pain or tenderness   Straight leg raising is normal  Strength & tone: normal   Stability overall normal stability  All other systems reviewed and are  negative   The patient has been educated about the nature of the problem(s) and counseled on treatment options.  The patient appeared to understand what I have discussed and is in agreement with it.  Encounter Diagnosis  Name Primary?  . Lumbar pain with radiation down left leg Yes    PLAN Call if any problems.  Precautions discussed.  Continue current medications.   Return to clinic 2 weeks   Get MRI of lumbar spine.  Electronically Signed , MD 7/8/202110:46 AM

## 2020-04-01 ENCOUNTER — Ambulatory Visit (INDEPENDENT_AMBULATORY_CARE_PROVIDER_SITE_OTHER): Payer: Medicaid Other

## 2020-04-01 ENCOUNTER — Encounter: Payer: Self-pay | Admitting: Obstetrics & Gynecology

## 2020-04-01 ENCOUNTER — Other Ambulatory Visit: Payer: Self-pay

## 2020-04-01 ENCOUNTER — Ambulatory Visit (INDEPENDENT_AMBULATORY_CARE_PROVIDER_SITE_OTHER): Payer: Medicaid Other | Admitting: Obstetrics & Gynecology

## 2020-04-01 VITALS — BP 121/71 | HR 66 | Ht 61.0 in | Wt 135.0 lb

## 2020-04-01 DIAGNOSIS — K589 Irritable bowel syndrome without diarrhea: Secondary | ICD-10-CM | POA: Diagnosis not present

## 2020-04-01 DIAGNOSIS — R102 Pelvic and perineal pain: Secondary | ICD-10-CM | POA: Diagnosis not present

## 2020-04-01 MED ORDER — POLYETHYLENE GLYCOL 3350 17 GM/SCOOP PO POWD: ORAL | 11 refills | Status: DC

## 2020-04-01 NOTE — Progress Notes (Signed)
PELVIC US TA/TV:normal vaginal cuff,mult small simple nabothian cysts,normal left ovary,two simple right ovarian cysts (#1) 2.9 x 2.5 x 2.7 cm,(#2) 2 x 1.3 x 1.5 cm,no free fluid,ovaries appear mobile,right adnexal pain during ultrasound   Chaperone Peggy

## 2020-04-01 NOTE — Progress Notes (Signed)
Follow up appointment for results  Chief Complaint  Patient presents with  . Follow-up    Ultrasound results    Blood pressure 121/71, pulse 66, height 5\' 1"  (1.549 m), weight 135 lb (61.2 kg).  PELVIS TRANSVAGINAL NON-OB (TV ONLY)  Result Date: 04/01/2020 GYNECOLOGIC SONOGRAM Jennifer Wright is a 54 y.o. G6P0 No LMP recorded. Patient has had a hysterectomy. She is here for a pelvic sonogram for pelvic pain. Uterus                     Surgically removed,normal vaginal cuff,mult small simple nabothian cysts Endometrium         N/A Right ovary             3.9 x 2.5 x 3.6 cm, two simple right ovarian cysts (#1) 2.9 x 2.5 x 2.7 cm,(#2) 2 x 1.3 x 1.5 cm Left ovary                3.2 x 1.9 x 2 cm, WNL No free fluid Technician Comments: PELVIC 44 TA/TV:normal vaginal cuff,mult small simple nabothian cysts,normal left ovary,two simple right ovarian cysts (#1) 2.9 x 2.5 x 2.7 cm,(#2) 2 x 1.3 x 1.5 cm,no free fluid,ovaries appear mobile,right adnexal pain during ultrasound Chaperone [address] 04/01/2020 3:04 PM Clinical Impression and recommendations: I have reviewed the sonogram results above, combined with the patient's current clinical course, below are my impressions and any appropriate recommendations for management based on the sonographic findings. Uterus endometrium surgically absent Right vary with small simple cysts present, clinically, her pain is not consistent with cystic ovarian pain or adhesions Left ovary normal 06/02/2020 04/01/2020 3:52 PM  06/02/2020 PELVIS (TRANSABDOMINAL ONLY)  Result Date: 04/01/2020 GYNECOLOGIC SONOGRAM Jennifer Wright is a 54 y.o. G6P0 No LMP recorded. Patient has had a hysterectomy. She is here for a pelvic sonogram for pelvic pain. Uterus                     Surgically removed,normal vaginal cuff,mult small simple nabothian cysts Endometrium         N/A Right ovary             3.9 x 2.5 x 3.6 cm, two simple right ovarian cysts (#1) 2.9 x 2.5 x 2.7 cm,(#2) 2 x 1.3 x  1.5 cm Left ovary                3.2 x 1.9 x 2 cm, WNL No free fluid Technician Comments: PELVIC 44 TA/TV:normal vaginal cuff,mult small simple nabothian cysts,normal left ovary,two simple right ovarian cysts (#1) 2.9 x 2.5 x 2.7 cm,(#2) 2 x 1.3 x 1.5 cm,no free fluid,ovaries appear mobile,right adnexal pain during ultrasound Chaperone [address] 04/01/2020 3:04 PM Clinical Impression and recommendations: I have reviewed the sonogram results above, combined with the patient's current clinical course, below are my impressions and any appropriate recommendations for management based on the sonographic findings. Uterus endometrium surgically absent Right vary with small simple cysts present, clinically, her pain is not consistent with cystic ovarian pain or adhesions Left ovary normal 06/02/2020 04/01/2020 3:52 PM     MEDS ordered this encounter: Meds ordered this encounter  Medications  . polyethylene glycol powder (GLYCOLAX/MIRALAX) 17 GM/SCOOP powder    Sig: 1 scoop daily or as needed    Dispense:  255 g    Refill:  11    Orders for this encounter: No orders  of the defined types were placed in this encounter.   Impression:   ICD-10-CM   1. Spasm of bowel  K58.9    miralax powder titrate todaily soft BM, source of pain by history is not consistent with the small cysts seen on sonogram or the nature of the pain  2. Pelvic pain  R10.2        Plan:   Follow Up: Return in about 2 months (around 06/02/2020) for Follow up, with Dr Despina Hidden.       Face to face time:  15 minutes  Greater than 50% of the visit time was spent in counseling and coordination of care with the patient.  The summary and outline of the counseling and care coordination is summarized in the note above.   All questions were answered.  Past Medical History:  Diagnosis Date  . Asbestos exposure   . Encounter for screening for malignant neoplasm of colon 12/07/2019  . Family history of diabetes mellitus  12/07/2019  . Family history of hyperlipidemia 12/07/2019  . Generalized headaches 12/07/2019  . Neck and shoulder pain 12/07/2019  . Shortness of breath 12/07/2019  . Thyroid disease   . Upper back pain 12/07/2019    Past Surgical History:  Procedure Laterality Date  . ABDOMINAL HYSTERECTOMY      OB History    Gravida  6   Para      Term      Preterm      AB      Living  6     SAB      TAB      Ectopic      Multiple      Live Births  6           No Known Allergies  Social History   Socioeconomic History  . Marital status: Legally Separated    Spouse name: Not on file  . Number of children: 6  . Years of education: Not on file  . Highest education level: 1st grade  Occupational History  . Not on file  Tobacco Use  . Smoking status: Never Smoker  . Smokeless tobacco: Never Used  Vaping Use  . Vaping Use: Never used  Substance and Sexual Activity  . Alcohol use: Never  . Drug use: Never  . Sexual activity: Yes    Birth control/protection: Condom, Surgical  Other Topics Concern  . Not on file  Social History Narrative   Moved from [place] at 43 years old   6 children   Lives with daughter -Corrie Dandy and Rolinda Roan' and her two children       Enjoys working around the house      Diet: eats all food groups   Caffeine: coffee 2-3 cups   Water: 4-6 cups daily      Wears seat belt    Does not drive, but can.   Smoke detectors at home           Social Determinants of Health   Financial Resource Strain: Low Risk   . Difficulty of Paying Living Expenses: Not hard at all  Food Insecurity: No Food Insecurity  . Worried About Programme researcher, broadcasting/film/video in the Last Year: Never true  . Ran Out of Food in the Last Year: Never true  Transportation Needs: No Transportation Needs  . Lack of Transportation (Medical): No  . Lack of Transportation (Non-Medical): No  Physical Activity: Inactive  . Days of Exercise per Week: 0 days  .  Minutes of Exercise per  Session: 0 min  Stress: No Stress Concern Present  . Feeling of Stress : Only a little  Social Connections: Socially Isolated  . Frequency of Communication with Friends and Family: More than three times a week  . Frequency of Social Gatherings with Friends and Family: More than three times a week  . Attends Religious Services: Never  . Active Member of Clubs or Organizations: No  . Attends Banker Meetings: Never  . Marital Status: Separated    Family History  Problem Relation Age of Onset  . Cancer Mother        stomach cancer  . Cancer Brother        stomach cancer

## 2020-04-09 ENCOUNTER — Other Ambulatory Visit: Payer: Self-pay | Admitting: Family Medicine

## 2020-04-09 DIAGNOSIS — N3281 Overactive bladder: Secondary | ICD-10-CM

## 2020-04-09 MED ORDER — HYOSCYAMINE SULFATE 0.125 MG PO TABS: 0.1250 mg | ORAL_TABLET | Freq: Four times a day (QID) | ORAL | 0 refills | Status: DC | PRN

## 2020-04-16 ENCOUNTER — Ambulatory Visit: Payer: Medicaid Other | Admitting: Urology

## 2020-04-22 ENCOUNTER — Other Ambulatory Visit: Payer: Self-pay

## 2020-04-22 DIAGNOSIS — E039 Hypothyroidism, unspecified: Secondary | ICD-10-CM

## 2020-04-22 MED ORDER — LEVOTHYROXINE SODIUM 75 MCG PO TABS: 75.0000 ug | ORAL_TABLET | Freq: Every day | ORAL | 0 refills | Status: DC

## 2020-04-23 ENCOUNTER — Ambulatory Visit (HOSPITAL_COMMUNITY): Payer: Medicaid Other

## 2020-04-23 ENCOUNTER — Ambulatory Visit: Payer: Medicaid Other | Admitting: Orthopaedic Surgery

## 2020-04-30 ENCOUNTER — Ambulatory Visit: Payer: Medicaid Other | Admitting: Orthopaedic Surgery

## 2020-05-07 ENCOUNTER — Encounter: Payer: Self-pay | Admitting: Family Medicine

## 2020-05-07 ENCOUNTER — Other Ambulatory Visit: Payer: Self-pay

## 2020-05-07 ENCOUNTER — Telehealth (INDEPENDENT_AMBULATORY_CARE_PROVIDER_SITE_OTHER): Payer: Medicaid Other | Admitting: Family Medicine

## 2020-05-07 VITALS — Ht 61.0 in | Wt 128.0 lb

## 2020-05-07 DIAGNOSIS — Z789 Other specified health status: Secondary | ICD-10-CM | POA: Diagnosis not present

## 2020-05-07 DIAGNOSIS — R42 Dizziness and giddiness: Secondary | ICD-10-CM | POA: Insufficient documentation

## 2020-05-07 DIAGNOSIS — F321 Major depressive disorder, single episode, moderate: Secondary | ICD-10-CM

## 2020-05-07 DIAGNOSIS — R200 Anesthesia of skin: Secondary | ICD-10-CM | POA: Insufficient documentation

## 2020-05-07 DIAGNOSIS — E782 Mixed hyperlipidemia: Secondary | ICD-10-CM | POA: Diagnosis not present

## 2020-05-07 DIAGNOSIS — R29898 Other symptoms and signs involving the musculoskeletal system: Secondary | ICD-10-CM

## 2020-05-07 MED ORDER — ROSUVASTATIN CALCIUM 10 MG PO TABS: 10.0000 mg | ORAL_TABLET | Freq: Every day | ORAL | 0 refills | Status: DC

## 2020-05-07 MED ORDER — BUSPIRONE HCL 5 MG PO TABS: 5.0000 mg | ORAL_TABLET | Freq: Two times a day (BID) | ORAL | 3 refills | Status: DC

## 2020-05-07 MED ORDER — SERTRALINE HCL 100 MG PO TABS: 100.0000 mg | ORAL_TABLET | Freq: Every day | ORAL | 3 refills | Status: DC

## 2020-05-07 NOTE — Patient Instructions (Addendum)
I appreciate the opportunity to provide you with care for your health and wellness. Today we discussed: several concerns  Follow up: 12 weeks in office needs interpreter   Lab tomorrow Referrals today to [facility] for therapy  Med changes: continue zoloft at 100mg  daily. Start buspar 5 mg twice daily.  Please continue to practice social distancing to keep you, your family, and our community safe.  If you must go out, please wear a mask and practice good handwashing.  It was a pleasure to see you and I look forward to continuing to work together on your health and well-being. Please do not hesitate to call the office if you need care or have questions about your care.  Have a wonderful day and week. With Gratitude, , DNP, AGNP-BC

## 2020-05-07 NOTE — Assessment & Plan Note (Signed)
Refill of medication provided  

## 2020-05-07 NOTE — Assessment & Plan Note (Signed)
Translator used to help with communication.

## 2020-05-07 NOTE — Assessment & Plan Note (Signed)
She continues to have depression.  I do think that being seen by someone will help her.  I would like to keep her on the Zoloft at this time and add BuSpar twice daily and will follow up with her in 3 months.

## 2020-05-07 NOTE — Assessment & Plan Note (Signed)
Reports not having any issues at this time.  Made aware that she needs to go to urgent care or emergency room if she were to have the signs of symptoms again.  Strongly encouraged her to be seen as soon as possible if she is to fill these things.  She reports understanding via Nurse, learning disability.

## 2020-05-07 NOTE — Assessment & Plan Note (Signed)
Reports not having any issues at this time.  Made aware that she needs to go to urgent care or emergency room if she were to have the signs of symptoms again.  Strongly encouraged her to be seen as soon as possible if she is to fill these things.  She reports understanding via translator. 

## 2020-05-07 NOTE — Progress Notes (Signed)
Virtual Visit via Telephone Note   This visit type was conducted due to national recommendations for restrictions regarding the COVID-19 Pandemic (e.g. social distancing) in an effort to limit this patient's exposure and mitigate transmission in our community.  Due to her co-morbid illnesses, this patient is at least at moderate risk for complications without adequate follow up.  This format is felt to be most appropriate for this patient at this time.  The patient did not have access to video technology/had technical difficulties with video requiring transitioning to audio format only (telephone).  All issues noted in this document were discussed and addressed.  No physical exam could be performed with this format.   Evaluation Performed:  Follow-up visit  Date:  05/07/2020   ID:  Jennifer Wright, DOB [birth year], MRN [id number]  Patient Location: Home Provider Location: Office/Clinic  Location of Patient: Home Location of Provider: Telehealth Consent was obtain for visit to be over via telehealth. I verified that I am speaking with the correct person using two identifiers.  PCP:  Freddy Finner, NP   Chief Complaint:  Depression follow up  History of Present Illness:    Jennifer Wright is a 54 y.o. female with with history of neck and shoulder pain, thyroid disease, vitamin D deficiency, migraines, GERD among others.  Reports today follow-up for depression but has several complaints as well. She reports about 2 weeks back she started having some dizziness. When she was walking at a job site-no Jennifer Wright it was very hot while working. She was working short staffed and had to do several jobs during that and it was very hot. Fatigue started, but then all of a sudden heer appetite was gone, then she developed diarrhea after that. Then not long after she went to [facility] for COVID shot, site pain, but overall ok. Then a few days later developed diarrhea and nausea, couldn't taste anything.  Diarrhea  is better as of last 24 hours.   She also complaints of some face numbness and arms and legs made weakness. When asked if this is current she says no. She reports doing ok at this moment.  She is still reporting being very depressed, takes medication as directed. No better. Is open to referral for therapy.   The patient does not have symptoms concerning for COVID-19 infection (fever, chills, cough, or new shortness of breath).   Past Medical, Surgical, Social History, Allergies, and Medications have been Reviewed.  Past Medical History:  Diagnosis Date  . Asbestos exposure   . Encounter for screening for malignant neoplasm of colon 12/07/2019  . Family history of diabetes mellitus 12/07/2019  . Family history of hyperlipidemia 12/07/2019  . Generalized headaches 12/07/2019  . Neck and shoulder pain 12/07/2019  . Shortness of breath 12/07/2019  . Thyroid disease   . Upper back pain 12/07/2019   Past Surgical History:  Procedure Laterality Date  . ABDOMINAL HYSTERECTOMY       Current Meds  Medication Sig  . albuterol (VENTOLIN HFA) 108 (90 Base) MCG/ACT inhaler Inhale 1 puff into the lungs every 6 (six) hours as needed for wheezing or shortness of breath. Inhale 1 puff into the lungs every 6 (six) hours as needed for wheezing or shortness of breath.  . budesonide-formoterol (SYMBICORT) 160-4.5 MCG/ACT inhaler Inhale 2 puffs into the lungs daily.  Marland Kitchen HYDROcodone-acetaminophen (NORCO/VICODIN) 5-325 MG tablet One tablet every four hours for pain.  . hyoscyamine (LEVSIN) 0.125 MG tablet Take 1 tablet (0.125  mg total) by mouth every 6 (six) hours as needed for bladder spasms.  Marland Kitchen levothyroxine (SYNTHROID) 75 MCG tablet Take 1 tablet (75 mcg total) by mouth daily before breakfast.  . naproxen (NAPROSYN) 500 MG tablet Take 1 tablet (500 mg total) by mouth 2 (two) times daily with a meal.  . omeprazole (PRILOSEC) 20 MG capsule Take 1 capsule (20 mg total) by mouth 2 (two) times daily before a meal.    . polyethylene glycol powder (GLYCOLAX/MIRALAX) 17 GM/SCOOP powder 1 scoop daily or as needed  . rosuvastatin (CRESTOR) 10 MG tablet Take 1 tablet (10 mg total) by mouth daily.  . sertraline (ZOLOFT) 100 MG tablet Take 1 tablet (100 mg total) by mouth daily.  . SUMAtriptan (IMITREX) 100 MG tablet Take 1 tablet (100 mg total) by mouth every 2 (two) hours as needed for migraine. May repeat in 2 hours if headache persists or recurs.  Marland Kitchen tiotropium (SPIRIVA) 18 MCG inhalation capsule Place 1 capsule (18 mcg total) into inhaler and inhale daily.  . Vitamin D, Ergocalciferol, (DRISDOL) 1.25 MG (50000 UNIT) CAPS capsule Take 1 capsule (50,000 Units total) by mouth every 7 (seven) days.  . [DISCONTINUED] hydrOXYzine (ATARAX/VISTARIL) 25 MG tablet Take 25 mg by mouth 3 (three) times daily as needed.  . [DISCONTINUED] promethazine (PHENERGAN) 25 MG tablet Take 1 tablet (25 mg total) by mouth every 6 (six) hours as needed for nausea or vomiting.  . [DISCONTINUED] rosuvastatin (CRESTOR) 10 MG tablet Take 1 tablet (10 mg total) by mouth daily.  . [DISCONTINUED] sertraline (ZOLOFT) 100 MG tablet Take 1 tablet (100 mg total) by mouth daily.     Allergies:   Patient has no known allergies.   ROS:   Please see the history of present illness.    All other systems reviewed and are negative.   Labs/Other Tests and Data Reviewed:    Recent Labs: 02/09/2020: ALT 23; BUN 10; Creat 0.61; Hemoglobin 13.7; Platelets 352; Potassium 4.1; Sodium 138; TSH 3.12   Recent Lipid Panel Lab Results  Component Value Date/Time   CHOL 250 (H) 02/09/2020 09:36 AM   TRIG 146 02/09/2020 09:36 AM   HDL 62 02/09/2020 09:36 AM   CHOLHDL 4.0 02/09/2020 09:36 AM   LDLCALC 160 (H) 02/09/2020 09:36 AM    Wt Readings from Last 3 Encounters:  05/07/20 128 lb (58.1 kg)  04/01/20 135 lb (61.2 kg)  03/28/20 135 lb 6.4 oz (61.4 kg)     Objective:    Vital Signs:  Ht 5\' 1"  (1.549 m)   Wt 128 lb (58.1 kg)   BMI 24.19 kg/m     VITAL SIGNS:  reviewed GEN:  alert and oriented RESPIRATORY:  no shortness of breath in conversation  PSYCH:  normal affect and mood    Depression screen [facility] 2/9 05/07/2020 03/13/2020 02/09/2020 12/07/2019  Decreased Interest 0 3 3 3   Down, Depressed, Hopeless 3 2 3 3   PHQ - 2 Score 3 5 6 6   Altered sleeping 0 3 3 3   Tired, decreased energy 3 3 3 3   Change in appetite 3 1 3 1   Feeling bad or failure about yourself  3 1 2 1   Trouble concentrating 3 2 3  0  Moving slowly or fidgety/restless 0 0 0 0  Suicidal thoughts 0 0 0 0  PHQ-9 Score 15 15 20 14   Difficult doing work/chores - Extremely dIfficult Very difficult Very difficult   GAD 7 : Generalized Anxiety Score 03/13/2020 02/09/2020  Nervous, Anxious, on  Edge 3 3  Control/stop worrying 3 2  Worry too much - different things 2 2  Trouble relaxing 1 3  Restless 1 2  Easily annoyed or irritable 1 3  Afraid - awful might happen 3 3  Total GAD 7 Score 14 18  Anxiety Difficulty Somewhat difficult Very difficult     ASSESSMENT & PLAN:    1. Depression, major, single episode, moderate (HCC)  - sertraline (ZOLOFT) 100 MG tablet; Take 1 tablet (100 mg total) by mouth daily.  Dispense: 30 tablet; Refill: 3 - busPIRone (BUSPAR) 5 MG tablet; Take 1 tablet (5 mg total) by mouth 2 (two) times daily.  Dispense: 60 tablet; Refill: 3  2. Language barrier to communication   3. Dizziness  - CBC - Comprehensive metabolic panel - TSH  4. Mixed hyperlipidemia  - rosuvastatin (CRESTOR) 10 MG tablet; Take 1 tablet (10 mg total) by mouth daily.  Dispense: 90 tablet; Refill: 0  5. Facial numbness   6. Weakness of both lower extremities   Time:   Today, I have spent 21 minutes with the patient with telehealth technology discussing the above problems.     Medication Adjustments/Labs and Tests Ordered: Current medicines are reviewed at length with the patient today.  Concerns regarding medicines are outlined above.   Tests  Ordered: Orders Placed This Encounter  Procedures  . CBC  . Comprehensive metabolic panel  . TSH    Medication Changes: Meds ordered this encounter  Medications  . rosuvastatin (CRESTOR) 10 MG tablet    Sig: Take 1 tablet (10 mg total) by mouth daily.    Dispense:  90 tablet    Refill:  0    Order Specific Question:   Supervising Provider    Answer:   SIMPSON, MARGARET E [[id number]]  . sertraline (ZOLOFT) 100 MG tablet    Sig: Take 1 tablet (100 mg total) by mouth daily.    Dispense:  30 tablet    Refill:  3    Order Specific Question:   Supervising Provider    Answer:   SIMPSON, MARGARET E [[id number]]  . busPIRone (BUSPAR) 5 MG tablet    Sig: Take 1 tablet (5 mg total) by mouth 2 (two) times daily.    Dispense:  60 tablet    Refill:  3    Order Specific Question:   Supervising Provider    Answer:   Kerri Perches [[id number]]    Disposition:  Follow up 3 months Signed, Freddy Finner, NP  05/07/2020 5:05 PM     [facility]

## 2020-05-07 NOTE — Assessment & Plan Note (Addendum)
Concern for multiple symptoms, unsure of true cause of several of these. I have suggested UC work up due to this being a phone visit.  She declined said she felt ok at this time.

## 2020-05-08 ENCOUNTER — Ambulatory Visit: Payer: Medicaid Other | Admitting: Family Medicine

## 2020-05-09 ENCOUNTER — Ambulatory Visit (HOSPITAL_COMMUNITY)
Admission: RE | Admit: 2020-05-09 | Discharge: 2020-05-09 | Disposition: A | Discharge status: Home / Self Care | Payer: Medicaid Other | Source: Ambulatory Visit | Attending: Orthopaedic Surgery | Admitting: Orthopaedic Surgery

## 2020-05-09 ENCOUNTER — Other Ambulatory Visit: Payer: Self-pay | Admitting: Family Medicine

## 2020-05-09 ENCOUNTER — Other Ambulatory Visit: Payer: Self-pay

## 2020-05-09 DIAGNOSIS — M545 Low back pain, unspecified: Secondary | ICD-10-CM

## 2020-05-09 DIAGNOSIS — M79605 Pain in left leg: Secondary | ICD-10-CM | POA: Diagnosis present

## 2020-05-09 LAB — CBC
HCT: 38 % (ref 35.0–45.0)
Hemoglobin: 12.6 g/dL (ref 11.7–15.5)
MCH: 27.1 pg (ref 27.0–33.0)
MCHC: 33.2 g/dL (ref 32.0–36.0)
MCV: 81.7 fL (ref 80.0–100.0)
MPV: 9.8 fL (ref 7.5–12.5)
Platelets: 274 10*3/uL (ref 140–400)
RBC: 4.65 10*6/uL (ref 3.80–5.10)
RDW: 14.7 % (ref 11.0–15.0)
WBC: 4.7 10*3/uL (ref 3.8–10.8)

## 2020-05-09 LAB — COMPLETE METABOLIC PANEL WITH GFR
AG Ratio: 1.8 (calc) (ref 1.0–2.5)
ALT: 97 U/L — ABNORMAL HIGH (ref 6–29)
AST: 90 U/L — ABNORMAL HIGH (ref 10–35)
Albumin: 4.6 g/dL (ref 3.6–5.1)
Alkaline phosphatase (APISO): 109 U/L (ref 37–153)
BUN/Creatinine Ratio: 15 (calc) (ref 6–22)
BUN: 7 mg/dL (ref 7–25)
CO2: 27 mmol/L (ref 20–32)
Calcium: 9.1 mg/dL (ref 8.6–10.4)
Chloride: 104 mmol/L (ref 98–110)
Creat: 0.46 mg/dL — ABNORMAL LOW (ref 0.50–1.05)
GFR, Est African American: 133 mL/min/{1.73_m2} (ref >=60)
GFR, Est Non African American: 114 mL/min/{1.73_m2} (ref >=60)
Globulin: 2.6 g/dL (calc) (ref 1.9–3.7)
Glucose, Bld: 102 mg/dL (ref 65–139)
Potassium: 3.7 mmol/L (ref 3.5–5.3)
Sodium: 139 mmol/L (ref 135–146)
Total Bilirubin: 0.5 mg/dL (ref 0.2–1.2)
Total Protein: 7.2 g/dL (ref 6.1–8.1)

## 2020-05-09 LAB — TSH: TSH: 3.02 mIU/L

## 2020-05-10 ENCOUNTER — Ambulatory Visit: Payer: Medicaid Other | Admitting: Gastroenterology

## 2020-05-10 ENCOUNTER — Encounter: Payer: Self-pay | Admitting: Gastroenterology

## 2020-05-10 VITALS — BP 121/65 | HR 86 | Temp 97.1°F | Ht 61.0 in | Wt 129.0 lb

## 2020-05-10 DIAGNOSIS — K219 Gastro-esophageal reflux disease without esophagitis: Secondary | ICD-10-CM | POA: Diagnosis not present

## 2020-05-10 DIAGNOSIS — R1011 Right upper quadrant pain: Secondary | ICD-10-CM | POA: Diagnosis not present

## 2020-05-10 MED ORDER — PANTOPRAZOLE SODIUM 40 MG PO TBEC: 40.0000 mg | DELAYED_RELEASE_TABLET | Freq: Two times a day (BID) | ORAL | 3 refills | Status: DC

## 2020-05-10 NOTE — Progress Notes (Signed)
Primary Care Physician:  Freddy Finner, NP  Referring Physician: Tereasa Coop, NP Primary Gastroenterologist:  Dr. Marletta Lor  Chief Complaint  Patient presents with  . Nausea    w/ vomiting, comes/goes but can have for few days before it goes away  . Abdominal Pain    2 weeks had a discomfort on RUQ.    HPI:   Jennifer Wright is a 54 y.o. female presenting today at the request of Tereasa Coop, NP, due to N/V and need for screening colonoscopy. Requires interpreter, who was present for visit. .     Elevated transaminases noted yesterday, with AST 90 and ALT 97. Normal values previously but also noted to have a signifciant bump in Sept 2020 with AST 123, ALT 155. US abdomen without cholecystitis in Sept 2020. Fatty liver noted.   RUQ/epigastric pain for 2-3 years. Intermittent. Unable to eat when pain hits. Diarrhea comes and goes. Previously with constipation. When starting to have diarrhea and vomiting, hurts in epigastric region. Posptrandial pain. Feels like she has something in her RUQ that pushes everything up. Feels like something is there that "doesn't fit". RUQ pain is always underlying. Even discomfort with touching. Pain will worsen from baseline. Doesn't routinely take NSAIDs but is now on Naproxen for back pain. Prilosec BID but doesn't feel like it does much. No belching. Decreased appetite with stomachache. Will feel like she can't eat anything at those times. When feels like this, tries to eat soft soups but will then vomit. Has these episodes every 2 months or so. When doesn't have these bouts of pain, will eat normal but gets constipated. Will take Miralax for constipation. Will have looser stool when having the pain and episodes. When these bouts happen, get very weak. Feels like legs don't respond, get the shakes. No dysphagia. 6 lbs down over the past few weeks. No melena. No hematochezia.   Past Medical History:  Diagnosis Date  . Asbestos exposure   . Encounter for  screening for malignant neoplasm of colon 12/07/2019  . Family history of diabetes mellitus 12/07/2019  . Family history of hyperlipidemia 12/07/2019  . Generalized headaches 12/07/2019  . Neck and shoulder pain 12/07/2019  . Shortness of breath 12/07/2019  . Thyroid disease   . Upper back pain 12/07/2019    Past Surgical History:  Procedure Laterality Date  . ABDOMINAL HYSTERECTOMY    . TUBAL LIGATION      Current Outpatient Medications  Medication Sig Dispense Refill  . albuterol (VENTOLIN HFA) 108 (90 Base) MCG/ACT inhaler Inhale 1 puff into the lungs every 6 (six) hours as needed for wheezing or shortness of breath. Inhale 1 puff into the lungs every 6 (six) hours as needed for wheezing or shortness of breath. 18 g 0  . budesonide-formoterol (SYMBICORT) 160-4.5 MCG/ACT inhaler Inhale 2 puffs into the lungs daily. 1 Inhaler 0  . busPIRone (BUSPAR) 5 MG tablet Take 1 tablet (5 mg total) by mouth 2 (two) times daily. 60 tablet 3  . HYDROcodone-acetaminophen (NORCO/VICODIN) 5-325 MG tablet One tablet every four hours for pain. 30 tablet 0  . hyoscyamine (LEVSIN) 0.125 MG tablet Take 1 tablet (0.125 mg total) by mouth every 6 (six) hours as needed for bladder spasms. 30 tablet 0  . levothyroxine (SYNTHROID) 75 MCG tablet Take 1 tablet (75 mcg total) by mouth daily before breakfast. 90 tablet 0  . mirtazapine (REMERON) 15 MG tablet Take 15 mg by mouth at bedtime.    . naproxen (  NAPROSYN) 500 MG tablet Take 1 tablet (500 mg total) by mouth 2 (two) times daily with a meal. 60 tablet 5  . omeprazole (PRILOSEC) 20 MG capsule Take 1 capsule (20 mg total) by mouth 2 (two) times daily before a meal. 60 capsule 2  . QUEtiapine (SEROQUEL) 50 MG tablet Take 100 mg by mouth at bedtime.    . rosuvastatin (CRESTOR) 10 MG tablet Take 1 tablet (10 mg total) by mouth daily. 90 tablet 0  . sertraline (ZOLOFT) 100 MG tablet Take 1 tablet (100 mg total) by mouth daily. 30 tablet 3  . tiotropium (SPIRIVA) 18 MCG  inhalation capsule Place 1 capsule (18 mcg total) into inhaler and inhale daily. 30 capsule 0  . Vitamin D, Ergocalciferol, (DRISDOL) 1.25 MG (50000 UNIT) CAPS capsule Take 1 capsule (50,000 Units total) by mouth every 7 (seven) days. 12 capsule 1  . pantoprazole (PROTONIX) 40 MG tablet Take 1 tablet (40 mg total) by mouth 2 (two) times daily before a meal. 90 tablet 3  . SUMAtriptan (IMITREX) 100 MG tablet Take 1 tablet (100 mg total) by mouth every 2 (two) hours as needed for migraine. May repeat once in 2 hours if headache persists or recurs. 10 tablet 0   No current facility-administered medications for this visit.    Allergies as of 05/10/2020  . (No Known Allergies)    Family History  Problem Relation Age of Onset  . Cancer Mother        stomach cancer  . Cancer Brother        stomach cancer  . Colon cancer Neg Hx   . Colon polyps Neg Hx     Social History   Socioeconomic History  . Marital status: Legally Separated    Spouse name: Not on file  . Number of children: 6  . Years of education: Not on file  . Highest education level: 1st grade  Occupational History  . Not on file  Tobacco Use  . Smoking status: Never Smoker  . Smokeless tobacco: Never Used  Vaping Use  . Vaping Use: Never used  Substance and Sexual Activity  . Alcohol use: Yes    Comment: socially  . Drug use: Never  . Sexual activity: Yes    Birth control/protection: Condom, Surgical  Other Topics Concern  . Not on file  Social History Narrative   Moved from [place] at 44 years old   6 children   Lives with daughter -Corrie Dandy and Rolinda Roan' and her two children       Enjoys working around the house      Diet: eats all food groups   Caffeine: coffee 2-3 cups   Water: 4-6 cups daily      Wears seat belt    Does not drive, but can.   Smoke detectors at home           Social Determinants of Health   Financial Resource Strain: Low Risk   . Difficulty of Paying Living Expenses: Not hard at all   Food Insecurity: No Food Insecurity  . Worried About Programme researcher, broadcasting/film/video in the Last Year: Never true  . Ran Out of Food in the Last Year: Never true  Transportation Needs: No Transportation Needs  . Lack of Transportation (Medical): No  . Lack of Transportation (Non-Medical): No  Physical Activity: Inactive  . Days of Exercise per Week: 0 days  . Minutes of Exercise per Session: 0 min  Stress: No Stress Concern Present  .  Feeling of Stress : Only a little  Social Connections: Socially Isolated  . Frequency of Communication with Friends and Family: More than three times a week  . Frequency of Social Gatherings with Friends and Family: More than three times a week  . Attends Religious Services: Never  . Active Member of Clubs or Organizations: No  . Attends Banker Meetings: Never  . Marital Status: Separated  Intimate Partner Violence: Not At Risk  . Fear of Current or Ex-Partner: No  . Emotionally Abused: No  . Physically Abused: No  . Sexually Abused: No    Review of Systems: Gen: Denies any fever, chills, fatigue, weight loss, lack of appetite.  CV: Denies chest pain, heart palpitations, peripheral edema, syncope.  Resp: +DOE GI: see HPI GU : Denies urinary burning, urinary frequency, urinary hesitancy MS: Denies joint pain, muscle weakness, cramps, or limitation of movement.  Derm: Denies rash, itching, dry skin Psych: Denies depression, anxiety, memory loss, and confusion Heme: Denies bruising, bleeding, and enlarged lymph nodes.  Physical Exam: BP 121/65   Pulse 86   Temp (!) 97.1 F (36.2 C)   Ht 5\' 1"  (1.549 m)   Wt 129 lb (58.5 kg)   BMI 24.37 kg/m  General:   Alert and oriented. Pleasant and cooperative. Well-nourished and well-developed.  Head:  Normocephalic and atraumatic. Eyes:  Without icterus, sclera clear and conjunctiva pink.  Ears:  Normal auditory acuity. Mouth: mask in place Lungs:  Clear to auscultation bilaterally. No wheezes,  rales, or rhonchi. No distress.  Heart:  S1, S2 present without murmurs appreciated.  Abdomen:  +BS, soft, TTP RUQ and epigastric and non-distended. No HSM noted. No guarding or rebound. No masses appreciated.  Rectal:  Deferred  Msk:  Symmetrical without gross deformities. Normal posture. Extremities:  Without edema. Neurologic:  Alert and  oriented x4;  grossly normal neurologically. Skin:  Intact without significant lesions or rashes. Psych:  Alert and cooperative. Normal mood and affect.  ASSESSMENT: KYRIN GARN is a 54 y.o. female presenting today with history of N/V, RUQ pain, and need for screening colonoscopy. Spanish-speaking, requires interpreter.   RUQ pain: with bump in transaminases recently (AST 90 and ALT 97), previously normal but also bumped in Sept 2020 with AST 123, ALT 155. 11-16-1992 in Sept 2020 with fatty liver. Will start by updating 11-16-1992 abdomen. May need HIDA. If unrevealing, recommend EGD. Will also stop Prilosec and start Protonix BID.   Screening colonoscopy: bouts of diarrhea with RUQ pain otherwise trends towards constipation and takes Miralax prn. ?element of IBS but RUQ seems to be more typically postprandial and concern for biliary component. Will arrange colonoscopy once abdominal pain, N/V addressed.    PLAN:  US abdomen, may need HIDA  Stop Prilosec. Start Protonix BID  Screening colonoscopy in future after abdominal pain, N/V addressed  [place], PhD, ANP-BC [facility]

## 2020-05-10 NOTE — Patient Instructions (Signed)
We are ordering an ultrasound of your gallbladder. You may need additional testing to check the function of your gallbladder. If this is negative, I recommend an upper endoscopy to check for ulcers.  Let's stop Prilosec. Start Protonix twice a day, 30 minutes before breakfast and dinner.   Further recommendations to follow!  It was a pleasure to see you today. I want to create trusting relationships with patients to provide genuine, compassionate, and quality care. I value your feedback. If you receive a survey regarding your visit,  I greatly appreciate you taking time to fill this out.   Gelene Mink, PhD, ANP-BC [facility]    Estamos solicitando una ecografa de su vescula biliar. Es posible que necesite pruebas adicionales para verificar el funcionamiento de su vescula biliar. Si esto es negativo, recomiendo una endoscopia digestiva alta para buscar lceras.  Detengamos el Prilosec. Inicie [employer], 30 minutos antes del desayuno y Physicist, medical.  Ms recomendaciones a seguir!  Fue un Arboriculturist. Elfredia Nevins crear relaciones de confianza con los pacientes para brindarles una atencin [place], [place] y de calidad. Valoro sus comentarios. Si recibe [employer] su visita, le agradezco mucho que se tome el tiempo para completarla.  Gelene Mink, PhD, ANP-BC [facility] [place]

## 2020-05-14 ENCOUNTER — Ambulatory Visit: Payer: Medicaid Other | Admitting: Urology

## 2020-05-14 ENCOUNTER — Other Ambulatory Visit: Payer: Self-pay | Admitting: Family Medicine

## 2020-05-14 DIAGNOSIS — G43009 Migraine without aura, not intractable, without status migrainosus: Secondary | ICD-10-CM

## 2020-05-14 MED ORDER — SUMATRIPTAN SUCCINATE 100 MG PO TABS: 100.0000 mg | ORAL_TABLET | ORAL | 0 refills | Status: DC | PRN

## 2020-05-15 ENCOUNTER — Other Ambulatory Visit: Payer: Self-pay

## 2020-05-15 ENCOUNTER — Ambulatory Visit (HOSPITAL_COMMUNITY)
Admission: RE | Admit: 2020-05-15 | Discharge: 2020-05-15 | Disposition: A | Discharge status: Home / Self Care | Payer: Medicaid Other | Source: Ambulatory Visit | Attending: Gastroenterology | Admitting: Gastroenterology

## 2020-05-15 DIAGNOSIS — R1011 Right upper quadrant pain: Secondary | ICD-10-CM | POA: Insufficient documentation

## 2020-05-16 ENCOUNTER — Encounter: Payer: Self-pay | Admitting: Gastroenterology

## 2020-05-16 NOTE — Progress Notes (Signed)
Cc'ed to pcp °

## 2020-05-21 ENCOUNTER — Other Ambulatory Visit: Payer: Self-pay

## 2020-05-21 ENCOUNTER — Ambulatory Visit (INDEPENDENT_AMBULATORY_CARE_PROVIDER_SITE_OTHER): Payer: Medicaid Other | Admitting: Orthopaedic Surgery

## 2020-05-21 ENCOUNTER — Encounter: Payer: Self-pay | Admitting: Orthopaedic Surgery

## 2020-05-21 VITALS — Ht 61.0 in | Wt 127.0 lb

## 2020-05-21 DIAGNOSIS — M545 Low back pain: Secondary | ICD-10-CM | POA: Diagnosis not present

## 2020-05-21 DIAGNOSIS — R1011 Right upper quadrant pain: Secondary | ICD-10-CM

## 2020-05-21 DIAGNOSIS — M79605 Pain in left leg: Secondary | ICD-10-CM | POA: Diagnosis not present

## 2020-05-21 MED ORDER — HYDROCODONE-ACETAMINOPHEN 5-325 MG PO TABS: ORAL_TABLET | ORAL | 0 refills | Status: DC

## 2020-05-21 NOTE — Progress Notes (Signed)
Patient FY:BOFB Jennifer Wright, female DOB:06-Oct-1965, 54 y.o. PZW:[id number]  Chief Complaint  Patient presents with  . Back Pain    MRI results    HPI  Jennifer Wright is a 54 y.o. female who has continued lower back pain and left sided sciatica.  She had MRI of the lumbar spine which showed: IMPRESSION: Lumbar spondylosis as outlined with findings most notably as follows.  At L4-L5, there is mild disc degeneration. Disc bulge with superimposed broad-based central disc protrusion at site of posterior annular fissure. The disc protrusion is eccentric to the right. Mild facet arthrosis/ligamentum flavum hypertrophy. The disc protrusion contributes to right greater than left subarticular stenosis with crowding of the descending right L5 nerve root and possible contact upon the descending left L5 nerve root. Mild relative narrowing of the central canal. Borderline mild bilateral neural foraminal narrowing.  At L2-L3, there is mild disc degeneration. Disc bulge with superimposed central disc protrusion. Mild relative bilateral subarticular and central canal narrowing without nerve root impingement.  At L3-L4, there is mild disc degeneration. Disc bulge with superimposed central disc protrusion. Mild relative bilateral subarticular and central canal narrowing without nerve root impingement.  At L5-S1, there is a very shallow right center to right subarticular disc protrusion at site of posterior annular fissure. Minimal relative right subarticular narrowing without nerve root impingement.  I have independently reviewed the MRI.     I have explained the findings to her via an interpretor.  I will have her see a neurosurgeon for further evaluation.  She understands.   Body mass index is 24 kg/m.  ROS  Review of Systems  Constitutional: Positive for activity change.  Respiratory: Positive for shortness of breath.   Musculoskeletal: Positive for arthralgias and back pain.   All other systems reviewed and are negative.   All other systems reviewed and are negative.  The following is a summary of the past history medically, past history surgically, known current medicines, social history and family history.  This information is gathered electronically by the computer from prior information and documentation.  I review this each visit and have found including this information at this point in the chart is beneficial and informative.    Past Medical History:  Diagnosis Date  . Asbestos exposure   . Encounter for screening for malignant neoplasm of colon 12/07/2019  . Family history of diabetes mellitus 12/07/2019  . Family history of hyperlipidemia 12/07/2019  . Generalized headaches 12/07/2019  . Neck and shoulder pain 12/07/2019  . Shortness of breath 12/07/2019  . Thyroid disease   . Upper back pain 12/07/2019    Past Surgical History:  Procedure Laterality Date  . ABDOMINAL HYSTERECTOMY    . TUBAL LIGATION      Family History  Problem Relation Age of Onset  . Cancer Mother        stomach cancer  . Cancer Brother        stomach cancer  . Colon cancer Neg Hx   . Colon polyps Neg Hx     Social History Social History   Tobacco Use  . Smoking status: Never Smoker  . Smokeless tobacco: Never Used  Vaping Use  . Vaping Use: Never used  Substance Use Topics  . Alcohol use: Yes    Comment: socially  . Drug use: Never    No Known Allergies  Current Outpatient Medications  Medication Sig Dispense Refill  . albuterol (VENTOLIN HFA) 108 (90 Base) MCG/ACT inhaler Inhale 1 puff into  the lungs every 6 (six) hours as needed for wheezing or shortness of breath. Inhale 1 puff into the lungs every 6 (six) hours as needed for wheezing or shortness of breath. 18 g 0  . budesonide-formoterol (SYMBICORT) 160-4.5 MCG/ACT inhaler Inhale 2 puffs into the lungs daily. 1 Inhaler 0  . busPIRone (BUSPAR) 5 MG tablet Take 1 tablet (5 mg total) by mouth 2 (two) times  daily. 60 tablet 3  . HYDROcodone-acetaminophen (NORCO/VICODIN) 5-325 MG tablet One tablet every four hours for pain. 30 tablet 0  . hyoscyamine (LEVSIN) 0.125 MG tablet Take 1 tablet (0.125 mg total) by mouth every 6 (six) hours as needed for bladder spasms. 30 tablet 0  . levothyroxine (SYNTHROID) 75 MCG tablet Take 1 tablet (75 mcg total) by mouth daily before breakfast. 90 tablet 0  . mirtazapine (REMERON) 15 MG tablet Take 15 mg by mouth at bedtime.    . naproxen (NAPROSYN) 500 MG tablet Take 1 tablet (500 mg total) by mouth 2 (two) times daily with a meal. 60 tablet 5  . omeprazole (PRILOSEC) 20 MG capsule Take 1 capsule (20 mg total) by mouth 2 (two) times daily before a meal. 60 capsule 2  . pantoprazole (PROTONIX) 40 MG tablet Take 1 tablet (40 mg total) by mouth 2 (two) times daily before a meal. 90 tablet 3  . QUEtiapine (SEROQUEL) 50 MG tablet Take 100 mg by mouth at bedtime.    . rosuvastatin (CRESTOR) 10 MG tablet Take 1 tablet (10 mg total) by mouth daily. 90 tablet 0  . sertraline (ZOLOFT) 100 MG tablet Take 1 tablet (100 mg total) by mouth daily. 30 tablet 3  . SUMAtriptan (IMITREX) 100 MG tablet Take 1 tablet (100 mg total) by mouth every 2 (two) hours as needed for migraine. May repeat once in 2 hours if headache persists or recurs. 10 tablet 0  . tiotropium (SPIRIVA) 18 MCG inhalation capsule Place 1 capsule (18 mcg total) into inhaler and inhale daily. 30 capsule 0  . Vitamin D, Ergocalciferol, (DRISDOL) 1.25 MG (50000 UNIT) CAPS capsule Take 1 capsule (50,000 Units total) by mouth every 7 (seven) days. 12 capsule 1   No current facility-administered medications for this visit.     Physical Exam  Height 5\' 1"  (1.549 m), weight 127 lb (57.6 kg).  Constitutional: overall normal hygiene, normal nutrition, well developed, normal grooming, normal body habitus. Assistive device:none  Musculoskeletal: gait and station Limp none, muscle tone and strength are normal, no tremors  or atrophy is present.  .  Neurological: coordination overall normal.  Deep tendon reflex/nerve stretch intact.  Sensation normal.  Cranial nerves II-XII intact.   Skin:   Normal overall no scars, lesions, ulcers or rashes. No psoriasis.  Psychiatric: Alert and oriented x 3.  Recent memory intact, remote memory unclear.  Normal mood and affect. Well groomed.  Good eye contact.  Cardiovascular: overall no swelling, no varicosities, no edema bilaterally, normal temperatures of the legs and arms, no clubbing, cyanosis and good capillary refill.  Lymphatic: palpation is normal.  Spine/Pelvis examination:  Inspection:  Overall, sacoiliac joint benign and hips nontender; without crepitus or defects.   Thoracic spine inspection: Alignment normal without kyphosis present   Lumbar spine inspection:  Alignment  with normal lumbar lordosis, without scoliosis apparent.   Thoracic spine palpation:  without tenderness of spinal processes   Lumbar spine palpation: without tenderness of lumbar area; without tightness of lumbar muscles    Range of Motion:   Lumbar  flexion, forward flexion is normal without pain or tenderness    Lumbar extension is full without pain or tenderness   Left lateral bend is normal without pain or tenderness   Right lateral bend is normal without pain or tenderness   Straight leg raising is normal  Strength & tone: normal   Stability overall normal stability  All other systems reviewed and are negative   The patient has been educated about the nature of the problem(s) and counseled on treatment options.  The patient appeared to understand what I have discussed and is in agreement with it.  Encounter Diagnosis  Name Primary?  . Lumbar pain with radiation down left leg Yes    PLAN Call if any problems.  Precautions discussed.  Continue current medications.   Return to clinic to neurosurgeon   Electronically Signed Darreld Mclean, MD 8/31/20212:57 PM

## 2020-06-03 ENCOUNTER — Other Ambulatory Visit: Payer: Self-pay

## 2020-06-03 ENCOUNTER — Ambulatory Visit (INDEPENDENT_AMBULATORY_CARE_PROVIDER_SITE_OTHER): Payer: Medicaid Other | Admitting: Obstetrics & Gynecology

## 2020-06-03 ENCOUNTER — Encounter: Payer: Self-pay | Admitting: Obstetrics & Gynecology

## 2020-06-03 VITALS — BP 119/74 | HR 71

## 2020-06-03 DIAGNOSIS — K589 Irritable bowel syndrome without diarrhea: Secondary | ICD-10-CM

## 2020-06-03 DIAGNOSIS — K5904 Chronic idiopathic constipation: Secondary | ICD-10-CM

## 2020-06-03 NOTE — Progress Notes (Signed)
Chief Complaint  Patient presents with  . Follow-up    pelvic pain      54 y.o. G6P0 No LMP recorded. Patient has had a hysterectomy. The current method of family planning is status post hysterectomy.  Outpatient Encounter Medications as of 06/03/2020  Medication Sig  . albuterol (VENTOLIN HFA) 108 (90 Base) MCG/ACT inhaler Inhale 1 puff into the lungs every 6 (six) hours as needed for wheezing or shortness of breath. Inhale 1 puff into the lungs every 6 (six) hours as needed for wheezing or shortness of breath.  . budesonide-formoterol (SYMBICORT) 160-4.5 MCG/ACT inhaler Inhale 2 puffs into the lungs daily.  . busPIRone (BUSPAR) 5 MG tablet Take 1 tablet (5 mg total) by mouth 2 (two) times daily.  Marland Kitchen HYDROcodone-acetaminophen (NORCO/VICODIN) 5-325 MG tablet One tablet every six hours for pain.  Limit 7 days.  . hyoscyamine (LEVSIN) 0.125 MG tablet Take 1 tablet (0.125 mg total) by mouth every 6 (six) hours as needed for bladder spasms.  Marland Kitchen levothyroxine (SYNTHROID) 75 MCG tablet Take 1 tablet (75 mcg total) by mouth daily before breakfast.  . mirtazapine (REMERON) 15 MG tablet Take 15 mg by mouth at bedtime.  . naproxen (NAPROSYN) 500 MG tablet Take 1 tablet (500 mg total) by mouth 2 (two) times daily with a meal.  . omeprazole (PRILOSEC) 20 MG capsule Take 1 capsule (20 mg total) by mouth 2 (two) times daily before a meal.  . pantoprazole (PROTONIX) 40 MG tablet Take 1 tablet (40 mg total) by mouth 2 (two) times daily before a meal.  . QUEtiapine (SEROQUEL) 50 MG tablet Take 100 mg by mouth at bedtime.  . rosuvastatin (CRESTOR) 10 MG tablet Take 1 tablet (10 mg total) by mouth daily.  . sertraline (ZOLOFT) 100 MG tablet Take 1 tablet (100 mg total) by mouth daily.  . SUMAtriptan (IMITREX) 100 MG tablet Take 1 tablet (100 mg total) by mouth every 2 (two) hours as needed for migraine. May repeat once in 2 hours if headache persists or recurs.  Marland Kitchen tiotropium (SPIRIVA) 18 MCG inhalation  capsule Place 1 capsule (18 mcg total) into inhaler and inhale daily.  . Vitamin D, Ergocalciferol, (DRISDOL) 1.25 MG (50000 UNIT) CAPS capsule Take 1 capsule (50,000 Units total) by mouth every 7 (seven) days.   No facility-administered encounter medications on file as of 06/03/2020.    Subjective Follow up on miralax  Pelvic pain felt due to bowel spasm on levsin Added miralax powder  Pt states she is improved with her GI symptoms She has significant disc findings on MRI and is to see a neurosurgeon based on Dr Jenetta Downer note Past Medical History:  Diagnosis Date  . Asbestos exposure   . Encounter for screening for malignant neoplasm of colon 12/07/2019  . Family history of diabetes mellitus 12/07/2019  . Family history of hyperlipidemia 12/07/2019  . Generalized headaches 12/07/2019  . Neck and shoulder pain 12/07/2019  . Shortness of breath 12/07/2019  . Thyroid disease   . Upper back pain 12/07/2019    Past Surgical History:  Procedure Laterality Date  . ABDOMINAL HYSTERECTOMY    . TUBAL LIGATION      OB History    Gravida  6   Para      Term      Preterm      AB      Living  6     SAB      TAB  Ectopic      Multiple      Live Births  6           No Known Allergies  Social History   Socioeconomic History  . Marital status: Legally Separated    Spouse name: Not on file  . Number of children: 6  . Years of education: Not on file  . Highest education level: 1st grade  Occupational History  . Not on file  Tobacco Use  . Smoking status: Never Smoker  . Smokeless tobacco: Never Used  Vaping Use  . Vaping Use: Never used  Substance and Sexual Activity  . Alcohol use: Yes    Comment: socially  . Drug use: Never  . Sexual activity: Yes    Birth control/protection: Condom, Surgical  Other Topics Concern  . Not on file  Social History Narrative   Moved from [place] at 62 years old   6 children   Lives with daughter -Corrie Dandy and Rolinda Roan' and  her two children       Enjoys working around the house      Diet: eats all food groups   Caffeine: coffee 2-3 cups   Water: 4-6 cups daily      Wears seat belt    Does not drive, but can.   Smoke detectors at home           Social Determinants of Health   Financial Resource Strain: Low Risk   . Difficulty of Paying Living Expenses: Not hard at all  Food Insecurity: No Food Insecurity  . Worried About Programme researcher, broadcasting/film/video in the Last Year: Never true  . Ran Out of Food in the Last Year: Never true  Transportation Needs: No Transportation Needs  . Lack of Transportation (Medical): No  . Lack of Transportation (Non-Medical): No  Physical Activity: Inactive  . Days of Exercise per Week: 0 days  . Minutes of Exercise per Session: 0 min  Stress: No Stress Concern Present  . Feeling of Stress : Only a little  Social Connections: Socially Isolated  . Frequency of Communication with Friends and Family: More than three times a week  . Frequency of Social Gatherings with Friends and Family: More than three times a week  . Attends Religious Services: Never  . Active Member of Clubs or Organizations: No  . Attends Banker Meetings: Never  . Marital Status: Separated    Family History  Problem Relation Age of Onset  . Cancer Mother        stomach cancer  . Cancer Brother        stomach cancer  . Colon cancer Neg Hx   . Colon polyps Neg Hx     Medications:       Current Outpatient Medications:  .  albuterol (VENTOLIN HFA) 108 (90 Base) MCG/ACT inhaler, Inhale 1 puff into the lungs every 6 (six) hours as needed for wheezing or shortness of breath. Inhale 1 puff into the lungs every 6 (six) hours as needed for wheezing or shortness of breath., Disp: 18 g, Rfl: 0 .  budesonide-formoterol (SYMBICORT) 160-4.5 MCG/ACT inhaler, Inhale 2 puffs into the lungs daily., Disp: 1 Inhaler, Rfl: 0 .  busPIRone (BUSPAR) 5 MG tablet, Take 1 tablet (5 mg total) by mouth 2 (two) times  daily., Disp: 60 tablet, Rfl: 3 .  HYDROcodone-acetaminophen (NORCO/VICODIN) 5-325 MG tablet, One tablet every six hours for pain.  Limit 7 days., Disp: 28 tablet, Rfl: 0 .  hyoscyamine (LEVSIN) 0.125 MG tablet, Take 1 tablet (0.125 mg total) by mouth every 6 (six) hours as needed for bladder spasms., Disp: 30 tablet, Rfl: 0 .  levothyroxine (SYNTHROID) 75 MCG tablet, Take 1 tablet (75 mcg total) by mouth daily before breakfast., Disp: 90 tablet, Rfl: 0 .  mirtazapine (REMERON) 15 MG tablet, Take 15 mg by mouth at bedtime., Disp: , Rfl:  .  naproxen (NAPROSYN) 500 MG tablet, Take 1 tablet (500 mg total) by mouth 2 (two) times daily with a meal., Disp: 60 tablet, Rfl: 5 .  omeprazole (PRILOSEC) 20 MG capsule, Take 1 capsule (20 mg total) by mouth 2 (two) times daily before a meal., Disp: 60 capsule, Rfl: 2 .  pantoprazole (PROTONIX) 40 MG tablet, Take 1 tablet (40 mg total) by mouth 2 (two) times daily before a meal., Disp: 90 tablet, Rfl: 3 .  QUEtiapine (SEROQUEL) 50 MG tablet, Take 100 mg by mouth at bedtime., Disp: , Rfl:  .  rosuvastatin (CRESTOR) 10 MG tablet, Take 1 tablet (10 mg total) by mouth daily., Disp: 90 tablet, Rfl: 0 .  sertraline (ZOLOFT) 100 MG tablet, Take 1 tablet (100 mg total) by mouth daily., Disp: 30 tablet, Rfl: 3 .  SUMAtriptan (IMITREX) 100 MG tablet, Take 1 tablet (100 mg total) by mouth every 2 (two) hours as needed for migraine. May repeat once in 2 hours if headache persists or recurs., Disp: 10 tablet, Rfl: 0 .  tiotropium (SPIRIVA) 18 MCG inhalation capsule, Place 1 capsule (18 mcg total) into inhaler and inhale daily., Disp: 30 capsule, Rfl: 0 .  Vitamin D, Ergocalciferol, (DRISDOL) 1.25 MG (50000 UNIT) CAPS capsule, Take 1 capsule (50,000 Units total) by mouth every 7 (seven) days., Disp: 12 capsule, Rfl: 1  Objective Blood pressure 119/74, pulse 71.  Gen WDWN NAD  Pertinent ROS No burning with urination, frequency or urgency No nausea, vomiting or  diarrhea Nor fever chills or other constitutional symptoms   Labs or studies reviewed    Impression Diagnoses this Encounter::   ICD-10-CM   1. Spasm of bowel  K58.9   2. Chronic idiopathic constipation  K59.04     Established relevant diagnosis(es):   Plan/Recommendations: No orders of the defined types were placed in this encounter.   Labs or Scans Ordered: No orders of the defined types were placed in this encounter.   Management:: Keep scheduled appts, no evidence of GYN pathology  Follow up No follow-ups on file.        Face to face time:  10 minutes  Greater than 50% of the visit time was spent in counseling and coordination of care with the patient.  The summary and outline of the counseling and care coordination is summarized in the note above.   All questions were answered.

## 2020-06-08 ENCOUNTER — Other Ambulatory Visit: Payer: Self-pay | Admitting: Family Medicine

## 2020-06-08 DIAGNOSIS — G43009 Migraine without aura, not intractable, without status migrainosus: Secondary | ICD-10-CM

## 2020-06-08 DIAGNOSIS — N3281 Overactive bladder: Secondary | ICD-10-CM

## 2020-06-11 ENCOUNTER — Other Ambulatory Visit (HOSPITAL_COMMUNITY): Payer: Medicaid Other

## 2020-06-12 ENCOUNTER — Other Ambulatory Visit: Payer: Self-pay

## 2020-06-12 ENCOUNTER — Encounter (HOSPITAL_COMMUNITY)
Admission: RE | Admit: 2020-06-12 | Discharge: 2020-06-12 | Disposition: A | Discharge status: Home / Self Care | Payer: Medicaid Other | Source: Ambulatory Visit | Attending: Gastroenterology | Admitting: Gastroenterology

## 2020-06-12 ENCOUNTER — Encounter (HOSPITAL_COMMUNITY): Payer: Self-pay

## 2020-06-12 DIAGNOSIS — R1011 Right upper quadrant pain: Secondary | ICD-10-CM | POA: Insufficient documentation

## 2020-06-12 HISTORY — DX: Unspecified asthma, uncomplicated: J45.909

## 2020-06-12 MED ORDER — TECHNETIUM TC 99M MEBROFENIN IV KIT
5.0000 | PACK | Freq: Once | INTRAVENOUS | Status: AC | PRN
  Administered 2020-06-12: 5.3 via INTRAVENOUS

## 2020-06-17 ENCOUNTER — Other Ambulatory Visit: Payer: Self-pay

## 2020-06-17 ENCOUNTER — Other Ambulatory Visit: Payer: Self-pay | Admitting: *Deleted

## 2020-06-17 DIAGNOSIS — G43009 Migraine without aura, not intractable, without status migrainosus: Secondary | ICD-10-CM

## 2020-06-17 DIAGNOSIS — Z79899 Other long term (current) drug therapy: Secondary | ICD-10-CM

## 2020-06-17 DIAGNOSIS — Z1159 Encounter for screening for other viral diseases: Secondary | ICD-10-CM

## 2020-06-17 DIAGNOSIS — N3281 Overactive bladder: Secondary | ICD-10-CM

## 2020-06-17 MED ORDER — HYOSCYAMINE SULFATE 0.125 MG PO TABS: 0.1250 mg | ORAL_TABLET | Freq: Four times a day (QID) | ORAL | 0 refills | Status: DC | PRN

## 2020-06-17 MED ORDER — SUMATRIPTAN SUCCINATE 100 MG PO TABS: 100.0000 mg | ORAL_TABLET | ORAL | 0 refills | Status: DC | PRN

## 2020-06-18 ENCOUNTER — Encounter: Payer: Self-pay | Admitting: *Deleted

## 2020-06-25 ENCOUNTER — Encounter: Payer: Self-pay | Admitting: Urology

## 2020-06-25 ENCOUNTER — Ambulatory Visit (INDEPENDENT_AMBULATORY_CARE_PROVIDER_SITE_OTHER): Payer: Medicaid Other | Admitting: Urology

## 2020-06-25 ENCOUNTER — Other Ambulatory Visit: Payer: Self-pay

## 2020-06-25 VITALS — BP 119/80 | HR 75 | Temp 98.8°F | Ht 61.0 in | Wt 127.0 lb

## 2020-06-25 DIAGNOSIS — N3941 Urge incontinence: Secondary | ICD-10-CM

## 2020-06-25 DIAGNOSIS — N3281 Overactive bladder: Secondary | ICD-10-CM | POA: Diagnosis not present

## 2020-06-25 DIAGNOSIS — R3915 Urgency of urination: Secondary | ICD-10-CM | POA: Diagnosis not present

## 2020-06-25 LAB — URINALYSIS, ROUTINE W REFLEX MICROSCOPIC
Bilirubin, UA: NEGATIVE
Glucose, UA: NEGATIVE
Ketones, UA: NEGATIVE
Leukocytes,UA: NEGATIVE
Nitrite, UA: NEGATIVE
Protein,UA: NEGATIVE
RBC, UA: NEGATIVE
Specific Gravity, UA: 1.015 (ref 1.005–1.030)
Urobilinogen, Ur: 0.2 mg/dL (ref 0.2–1.0)
pH, UA: 6.5 (ref 5.0–7.5)

## 2020-06-25 LAB — BLADDER SCAN AMB NON-IMAGING: Scan Result: 0

## 2020-06-25 MED ORDER — FESOTERODINE FUMARATE ER 8 MG PO TB24: 8.0000 mg | ORAL_TABLET | Freq: Every day | ORAL | 11 refills | Status: DC

## 2020-06-25 NOTE — Progress Notes (Signed)
Urological Symptom Review  Patient is experiencing the following symptoms: Frequent urination Get up at night to urinate Leakage of urine   Review of Systems  Gastrointestinal (upper)  : Nausea Indigestion/heartburn  Gastrointestinal (lower) : Constipation  Constitutional : Night Sweats Fatigue  Skin: Negative for skin symptoms  Eyes: Blurred vision  Ear/Nose/Throat : Negative for Ear/Nose/Throat symptoms  Hematologic/Lymphatic: Negative for Hematologic/Lymphatic symptoms  Cardiovascular : Leg swelling Chest pain  Respiratory : Cough Shortness of breath  Endocrine: Negative for endocrine symptoms  Musculoskeletal: Back pain Joint pain  Neurological: Headaches  Psychologic: Depression Anxiety

## 2020-06-25 NOTE — Progress Notes (Signed)
H&P  Chief Complaint: UUI/SUI  History of Present Illness: Pt presents with UUI/SUI (onset 1.5 years ago and recently worsening). Pt denies any recent UTIs or dysuria. Pt reports a good FOS, but occasionally feels that she is unable to completely empty her bladders. She uses approximately 10 pads per day and changes them as soon as they are dampened. Pt is incontinent with urgency, coughing, sneezing, but reports urgency causes the largest leakage volume. Pt denies drinking much coffee or soda.  Pt was prescribed Hyoscyamine but notes that it has not helped her symptoms and causes constipation. She only takes it twice a day.  Past Medical History:  Diagnosis Date  . Anxiety   . Asbestos exposure   . Asthma   . Depression   . Encounter for screening for malignant neoplasm of colon 12/07/2019  . Family history of diabetes mellitus 12/07/2019  . Family history of hyperlipidemia 12/07/2019  . Generalized headaches 12/07/2019  . GERD (gastroesophageal reflux disease)   . Hyperlipemia   . Neck and shoulder pain 12/07/2019  . Shortness of breath 12/07/2019  . Thyroid disease   . Upper back pain 12/07/2019    Past Surgical History:  Procedure Laterality Date  . ABDOMINAL HYSTERECTOMY    . TUBAL LIGATION      Home Medications:  Allergies as of 06/25/2020   No Known Allergies     Medication List       Accurate as of June 25, 2020 10:33 AM. If you have any questions, ask your nurse or doctor.        albuterol 108 (90 Base) MCG/ACT inhaler Commonly known as: VENTOLIN HFA Inhale 1 puff into the lungs every 6 (six) hours as needed for wheezing or shortness of breath. Inhale 1 puff into the lungs every 6 (six) hours as needed for wheezing or shortness of breath.   budesonide-formoterol 160-4.5 MCG/ACT inhaler Commonly known as: Symbicort Inhale 2 puffs into the lungs daily.   busPIRone 5 MG tablet Commonly known as: BUSPAR Take 1 tablet (5 mg total) by mouth 2 (two) times daily.     HYDROcodone-acetaminophen 5-325 MG tablet Commonly known as: NORCO/VICODIN One tablet every six hours for pain.  Limit 7 days.   hyoscyamine 0.125 MG tablet Commonly known as: LEVSIN Take 1 tablet (0.125 mg total) by mouth every 6 (six) hours as needed for bladder spasms.   levothyroxine 75 MCG tablet Commonly known as: SYNTHROID Take 1 tablet (75 mcg total) by mouth daily before breakfast.   mirtazapine 15 MG tablet Commonly known as: REMERON Take 15 mg by mouth at bedtime.   naproxen 500 MG tablet Commonly known as: NAPROSYN Take 1 tablet (500 mg total) by mouth 2 (two) times daily with a meal.   omeprazole 20 MG capsule Commonly known as: PRILOSEC Take 1 capsule (20 mg total) by mouth 2 (two) times daily before a meal.   pantoprazole 40 MG tablet Commonly known as: PROTONIX Take 1 tablet (40 mg total) by mouth 2 (two) times daily before a meal.   QUEtiapine 50 MG tablet Commonly known as: SEROQUEL Take 100 mg by mouth at bedtime.   rosuvastatin 10 MG tablet Commonly known as: CRESTOR Take 1 tablet (10 mg total) by mouth daily.   sertraline 100 MG tablet Commonly known as: ZOLOFT Take 1 tablet (100 mg total) by mouth daily.   SUMAtriptan 100 MG tablet Commonly known as: IMITREX Take 1 tablet (100 mg total) by mouth every 2 (two) hours as needed for  migraine. May repeat once in 2 hours if headache persists or recurs.   tiotropium 18 MCG inhalation capsule Commonly known as: SPIRIVA Place 1 capsule (18 mcg total) into inhaler and inhale daily.   Vitamin D (Ergocalciferol) 1.25 MG (50000 UNIT) Caps capsule Commonly known as: DRISDOL Take 1 capsule (50,000 Units total) by mouth every 7 (seven) days.       Allergies: No Known Allergies  Family History  Problem Relation Age of Onset  . Cancer Mother        stomach cancer  . Cancer Brother        stomach cancer  . Colon cancer Neg Hx   . Colon polyps Neg Hx     Social History:  reports that she has  never smoked. She has never used smokeless tobacco. She reports current alcohol use. She reports that she does not use drugs.  ROS: A complete review of systems was performed.  All systems are negative except for pertinent findings as noted.  Physical Exam:  Vital signs in last 24 hours: BP 119/80   Pulse 75   Temp 98.8 F (37.1 C)   Ht 5\' 1"  (1.549 m)   Wt 127 lb (57.6 kg)   BMI 24.00 kg/m  Constitutional:  Alert and oriented, No acute distress Cardiovascular: Regular rate  Respiratory: Normal respiratory effort Neurologic: Grossly intact, no focal deficits Psychiatric: Normal mood and affect  I have reviewed prior pt notes  I have reviewed notes from referring/previous physicians  I have reviewed urinalysis results  I have independently reviewed bladder scan   Impression/Assessment:  1. Overactive bladder with urgency incontinence  Plan:  1. Pt started on toviaz and provided with/oriented to OAB guidesheet for self-help measures to ameliorate her urinary symptomatology.  2. F/U in 3 months for OV and symptom recheck.  3. She will stop her hyoscyamine  CC: , NP

## 2020-06-27 LAB — HEPATIC FUNCTION PANEL
ALT: 32 IU/L (ref 0–32)
AST: 47 IU/L — ABNORMAL HIGH (ref 0–40)
Albumin: 4.5 g/dL (ref 3.8–4.9)
Alkaline Phosphatase: 112 IU/L (ref 44–121)
Bilirubin Total: 0.3 mg/dL (ref 0.0–1.2)
Bilirubin, Direct: <0.1 mg/dL (ref 0.00–0.40)
Total Protein: 7.1 g/dL (ref 6.0–8.5)

## 2020-06-27 LAB — HEPATITIS B CORE ANTIBODY, TOTAL: Hep B Core Total Ab: NEGATIVE

## 2020-06-27 LAB — IRON,TIBC AND FERRITIN PANEL
Ferritin: 43 ng/mL (ref 15–150)
Iron Saturation: 16 % (ref 15–55)
Iron: 53 ug/dL (ref 27–159)
Total Iron Binding Capacity: 331 ug/dL (ref 250–450)
UIBC: 278 ug/dL (ref 131–425)

## 2020-06-27 LAB — HEPATITIS B SURFACE ANTIBODY,QUALITATIVE: Hep B Surface Ab, Qual: REACTIVE

## 2020-06-27 LAB — HEPATITIS A ANTIBODY, TOTAL: hep A Total Ab: POSITIVE — AB

## 2020-06-27 LAB — HEPATITIS B SURFACE ANTIGEN: Hepatitis B Surface Ag: NEGATIVE

## 2020-06-27 LAB — HEPATITIS C ANTIBODY: Hep C Virus Ab: <0.1 s/co ratio (ref 0.0–0.9)

## 2020-07-17 ENCOUNTER — Other Ambulatory Visit: Payer: Self-pay

## 2020-07-17 ENCOUNTER — Encounter (HOSPITAL_COMMUNITY): Payer: Self-pay | Admitting: Physical Therapy

## 2020-07-17 ENCOUNTER — Ambulatory Visit (HOSPITAL_COMMUNITY): Payer: Medicaid Other | Attending: Neurological Surgery | Admitting: Physical Therapy

## 2020-07-17 DIAGNOSIS — M545 Low back pain, unspecified: Secondary | ICD-10-CM | POA: Diagnosis present

## 2020-07-17 DIAGNOSIS — M542 Cervicalgia: Secondary | ICD-10-CM | POA: Diagnosis present

## 2020-07-17 DIAGNOSIS — M6281 Muscle weakness (generalized): Secondary | ICD-10-CM | POA: Diagnosis present

## 2020-07-17 DIAGNOSIS — R29898 Other symptoms and signs involving the musculoskeletal system: Secondary | ICD-10-CM | POA: Diagnosis present

## 2020-07-17 DIAGNOSIS — R2689 Other abnormalities of gait and mobility: Secondary | ICD-10-CM | POA: Diagnosis present

## 2020-07-17 NOTE — Therapy (Signed)
[facility] [address] [place], [place], [address] Phone: [phone]   Fax:  [phone]  Physical Therapy Evaluation  Patient Details  Name: Jennifer Wright MRN: [id number] Date of Birth: April 17, 1966 Referring Provider (PT): Kendell Bane Dawley DO   Encounter Date: 07/17/2020   PT End of Session - 07/17/20 0919    Visit Number 1    Number of Visits 12    Date for PT Re-Evaluation 08/28/20    Authorization Type [employer] community health medicaid (no aurth required for pt's over 21, 27 comb visits for year)    Authorization - Visit Number 1    Authorization - Number of Visits 27    Progress Note Due on Visit 10    PT Start Time 0830    PT Stop Time 0915    PT Time Calculation (min) 45 min    Activity Tolerance Patient tolerated treatment well    Behavior During Therapy [facility] for tasks assessed/performed           Past Medical History:  Diagnosis Date  . Anxiety   . Asbestos exposure   . Asthma   . Depression   . Encounter for screening for malignant neoplasm of colon 12/07/2019  . Family history of diabetes mellitus 12/07/2019  . Family history of hyperlipidemia 12/07/2019  . Generalized headaches 12/07/2019  . GERD (gastroesophageal reflux disease)   . Hyperlipemia   . Neck and shoulder pain 12/07/2019  . Shortness of breath 12/07/2019  . Thyroid disease   . Upper back pain 12/07/2019    Past Surgical History:  Procedure Laterality Date  . ABDOMINAL HYSTERECTOMY    . TUBAL LIGATION      There were no vitals filed for this visit.    Subjective Assessment - 07/17/20 0831    Subjective Patient is a 54 y.o. female who presents to physical therapy with c/o low back pain. She also has pain in her neck and upper back. Sometimes pain shoots down her L leg but this week she had trouble with her right leg. She has had back for the last 3 or 4 years with insidious onset. Her neck and back both bother her. Symptoms aggravated by sitting, bending,  or laying in one position. She has trouble sleeping due to discomfort. She has not found anything that makes her back better besides pain medication which helps for a couple of hours. Her main goal is to find a way to work with the pain. She states pain in her legs numbness but still painful and heavy. She has been having incontinence that has been going on for about 1 year. She denies numbness/tingling in groin region.    Patient is accompained by: Interpreter   Jennifer Wright   Limitations Lifting;Other (comment);Sitting;House hold activities   bending, sleeping   Patient Stated Goals to be able to work with the pain    Currently in Pain? Yes    Pain Score 7     Pain Location Back    Pain Orientation Lower    Pain Descriptors / Indicators Numbness;Aching    Pain Type Chronic pain    Pain Onset More than a month ago    Pain Frequency Constant              [facility] PT Assessment - 07/17/20 0001      Assessment   Medical Diagnosis Lumbar Spondylosis    Referring Provider (PT) Kendell Bane Dawley DO    Onset Date/Surgical Date 07/17/17  Next MD Visit none scheduled    Prior Therapy no      Precautions   Precautions None      Restrictions   Weight Bearing Restrictions No      Balance Screen   Has the patient fallen in the past 6 months No    Has the patient had a decrease in activity level because of a fear of falling?  No    Is the patient reluctant to leave their home because of a fear of falling?  No      Prior Function   Level of Independence Independent    Vocation Full time employment    Youth workerVocation Requirements Manufacturing      Cognition   Overall Cognitive Status Within Functional Limits for tasks assessed      Observation/Other Assessments   Observations Ambulates without AD    Focus on Therapeutic Outcomes (FOTO)  complete next session      Sensation   Light Touch Impaired Detail    Additional Comments decreased L L2, R L5      ROM / Strength   AROM / PROM / Strength  AROM;Strength      AROM   Overall AROM Comments no change in LE symptoms with AROM    AROM Assessment Site Lumbar    Lumbar Flexion 0% limited - pain in hamstrings/ pulling worse on RLE    Lumbar Extension 25% limited pain in low back    Lumbar - Right Side Bend 0% limited    Lumbar - Left Side Bend 0% limited    Lumbar - Right Rotation 0% limited    Lumbar - Left Rotation 0% limited      Strength   Overall Strength Comments patient felt weaker on RLE    Strength Assessment Site Hip;Knee;Ankle    Right/Left Hip Right;Left    Right Hip Flexion 4/5    Left Hip Flexion 4+/5    Right/Left Knee Right;Left    Right Knee Flexion 4/5    Right Knee Extension 4+/5    Left Knee Flexion 5/5    Left Knee Extension 5/5    Right/Left Ankle Right;Left    Right Ankle Dorsiflexion 5/5    Left Ankle Dorsiflexion 5/5      Palpation   Spinal mobility hypomobile lumbar and thoracic spine, pain L4-L5    Palpation comment TTP lumbar and throacic paraspinals, no tenderness in glutes or piriformis bilateral      Transfers   Five time sit to stand comments  13.47 seconds    Comments without UE use, weight back on heels                      Objective measurements completed on examination: See above findings.       [facility] Adult PT Treatment/Exercise - 07/17/20 0001      Exercises   Exercises Lumbar      Lumbar Exercises: Stretches   Prone on Elbows Stretch 5 reps;20 seconds                  PT Education - 07/17/20 0830    Education Details Patient educated on exam findings, POC, scope of PT, initial HEP    Person(s) Educated Patient    Methods Explanation;Demonstration;Handout    Comprehension Verbalized understanding;Returned demonstration            PT Short Term Goals - 07/17/20 0930      PT SHORT TERM GOAL #1  Title Patient will be independent with HEP in order to improve functional outcomes.    Time 3    Period Weeks    Status New    Target Date  08/07/20      PT SHORT TERM GOAL #2   Title Patient will report at least 25% improvement in symptoms for improved quality of life.    Time 3    Period Weeks    Status New    Target Date 08/07/20             PT Long Term Goals - 07/17/20 0931      PT LONG TERM GOAL #1   Title Patient will report at least 75% improvement in symptoms for improved quality of life.    Time 6    Period Weeks    Status New    Target Date 08/28/20      PT LONG TERM GOAL #2   Title Patient will be able to complete 5x STS in under 11.4 seconds in order to reduce the risk of falls and demonstrate improved functional strength.    Time 6    Period Weeks    Status New    Target Date 08/28/20      PT LONG TERM GOAL #3   Title Patient will report centralized lumbar symptoms with symptoms no greater than 3/10 for improved ability to work.    Time 6    Period Weeks    Status New    Target Date 08/28/20                  Plan - 07/17/20 4166    Clinical Impression Statement Patient is a 54 y.o. female who presents to physical therapy with c/o chronic low back pain and bilateral LE symptoms and neck pain. She presents with pain limited deficits in lumbar and LE strength, sensation, ROM, endurance, postural impairments, spinal mobility, hyperactive and tender musculature, and functional mobility with ADL. She is having to modify and restrict ADL as indicated by subjective information and objective measures which is affecting overall participation. Patient will benefit from skilled physical therapy in order to improve function and reduce impairment.    Personal Factors and Comorbidities Behavior Pattern;Past/Current Experience;Education;Time since onset of injury/illness/exacerbation;Social Background;Fitness;Comorbidity 3+    Comorbidities anxiety, depression, chronic pain, HLD    Examination-Activity Limitations Bend;Lift;Sit;Continence;Bed Mobility    Examination-Participation Restrictions  Occupation;Cleaning;Community Activity;Volunteer;Yard Work;Shop    Stability/Clinical Decision Making Evolving/Moderate complexity    Clinical Decision Making Moderate    Rehab Potential Fair    PT Frequency 2x / week    PT Duration 6 weeks    PT Treatment/Interventions ADLs/Self Care Home Management;Aquatic Therapy;Canalith Repostioning;Cryotherapy;Electrical Stimulation;Biofeedback;Iontophoresis 4mg /ml Dexamethasone;Moist Heat;Traction;Ultrasound;DME Instruction;Gait training;Stair training;Functional mobility training;Therapeutic activities;Therapeutic exercise;Balance training;Neuromuscular re-education;Patient/family education;Orthotic Fit/Training;Manual techniques;Compression bandaging;Scar mobilization;Passive range of motion;Dry needling;Energy conservation;Splinting;Taping;Vestibular;Spinal Manipulations;Joint Manipulations    PT Next Visit Plan Complete FOTO, f/u with POE stretch, possibly begin lumbar extension in standing, begin core strengthing, further assess hip strength and possibly add hip strengthening, postural strengthing. Further assess cervical and headache symptoms when able. Possibly manual for pain/mobility    PT Home Exercise Plan 10/27 POE    Consulted and Agree with Plan of Care Patient           Patient will benefit from skilled therapeutic intervention in order to improve the following deficits and impairments:  Decreased range of motion, Decreased endurance, Increased muscle spasms, Decreased activity tolerance, Hypomobility, Pain, Impaired flexibility, Decreased balance, Decreased mobility, Decreased  strength, Impaired sensation, Improper body mechanics  Visit Diagnosis: Low back pain, unspecified back pain laterality, unspecified chronicity, unspecified whether sciatica present  Cervicalgia  Muscle weakness (generalized)  Other abnormalities of gait and mobility  Other symptoms and signs involving the musculoskeletal system     Problem List Patient  Active Problem List   Diagnosis Date Noted  . RUQ pain 05/10/2020  . Dizziness 05/07/2020  . Facial numbness 05/07/2020  . Weakness of both lower extremities 05/07/2020  . Acute left-sided low back pain with left-sided sciatica 03/13/2020  . Overactive bladder 02/13/2020  . Acute pain of left knee 02/13/2020  . Left hip pain 02/13/2020  . Gastroesophageal reflux disease 02/13/2020  . Urgency of urination 02/13/2020  . Mixed hyperlipidemia 02/09/2020  . Vitamin D deficiency 12/07/2019  . Hypothyroidism 12/07/2019  . Iron deficiency 12/07/2019  . Depression, major, single episode, moderate (HCC) 12/07/2019  . Shortness of breath 12/07/2019  . Language barrier to communication 12/07/2019    9:34 AM, 07/17/20 Wyman Songster PT, DPT Physical Therapist at [facility]   [facility] [address] [place], [place], [address] Phone: [phone]   Fax:  [phone]  Name: Jennifer Wright MRN: [id number] Date of Birth: [birth year]

## 2020-07-17 NOTE — Patient Instructions (Signed)
Access Code: [email or web address] URL: [email or web address] Date: 07/17/2020 Prepared by: Greig Castilla Charnele Semple  Exercises Estiramiento de codos en dec&uacute;bito prono - 3 x daily - 7 x weekly - 5 reps - 20 second hold

## 2020-07-18 ENCOUNTER — Other Ambulatory Visit: Payer: Self-pay

## 2020-07-18 DIAGNOSIS — G43009 Migraine without aura, not intractable, without status migrainosus: Secondary | ICD-10-CM

## 2020-07-18 MED ORDER — SUMATRIPTAN SUCCINATE 100 MG PO TABS: 100.0000 mg | ORAL_TABLET | ORAL | 0 refills | Status: DC | PRN

## 2020-07-25 ENCOUNTER — Other Ambulatory Visit: Payer: Self-pay

## 2020-07-25 ENCOUNTER — Encounter (HOSPITAL_COMMUNITY): Payer: Self-pay | Admitting: Physical Therapy

## 2020-07-25 ENCOUNTER — Ambulatory Visit (HOSPITAL_COMMUNITY): Payer: Medicaid Other | Attending: Neurological Surgery | Admitting: Physical Therapy

## 2020-07-25 DIAGNOSIS — R2689 Other abnormalities of gait and mobility: Secondary | ICD-10-CM | POA: Insufficient documentation

## 2020-07-25 DIAGNOSIS — R29898 Other symptoms and signs involving the musculoskeletal system: Secondary | ICD-10-CM | POA: Diagnosis present

## 2020-07-25 DIAGNOSIS — M545 Low back pain, unspecified: Secondary | ICD-10-CM | POA: Insufficient documentation

## 2020-07-25 DIAGNOSIS — M542 Cervicalgia: Secondary | ICD-10-CM | POA: Insufficient documentation

## 2020-07-25 DIAGNOSIS — M6281 Muscle weakness (generalized): Secondary | ICD-10-CM | POA: Diagnosis present

## 2020-07-25 NOTE — Therapy (Signed)
[facility] [address] [place], [place], [address] Phone: [phone]   Fax:  [phone]  Physical Therapy Treatment  Patient Details  Name: Jennifer Wright MRN: [id number] Date of Birth: Feb 11, 1966 Referring Provider (PT): Kendell Bane Dawley DO   Encounter Date: 07/25/2020   PT End of Session - 07/25/20 0930    Visit Number 2    Number of Visits 12    Date for PT Re-Evaluation 08/28/20    Authorization Type [employer] community health medicaid (no aurth required for pt's over 21, 27 comb visits for year)    Authorization - Visit Number 2    Authorization - Number of Visits 27    Progress Note Due on Visit 10    PT Start Time [id number]    PT Stop Time 0912    PT Time Calculation (min) 39 min    Activity Tolerance Patient tolerated treatment well    Behavior During Therapy [facility] for tasks assessed/performed           Past Medical History:  Diagnosis Date  . Anxiety   . Asbestos exposure   . Asthma   . Depression   . Encounter for screening for malignant neoplasm of colon 12/07/2019  . Family history of diabetes mellitus 12/07/2019  . Family history of hyperlipidemia 12/07/2019  . Generalized headaches 12/07/2019  . GERD (gastroesophageal reflux disease)   . Hyperlipemia   . Neck and shoulder pain 12/07/2019  . Shortness of breath 12/07/2019  . Thyroid disease   . Upper back pain 12/07/2019    Past Surgical History:  Procedure Laterality Date  . ABDOMINAL HYSTERECTOMY    . TUBAL LIGATION      There were no vitals filed for this visit.   Subjective Assessment - 07/25/20 0930    Subjective States her pain is the same, like a 7/10 and  iti s in her back, between her shoulder blades, no current symptoms in her leg.    Patient is accompained by: Interpreter   [name]   Limitations Lifting;Other (comment);Sitting;House hold activities   bending, sleeping   Patient Stated Goals to be able to work with the pain    Currently in Pain? Yes    Pain  Score 7     Pain Location Back    Pain Orientation Upper;Lower    Pain Descriptors / Indicators Aching    Pain Onset More than a month ago              [facility] PT Assessment - 07/25/20 0001      Assessment   Medical Diagnosis Lumbar Spondylosis    Referring Provider (PT) Kendell Bane Dawley DO                         [facility] Adult PT Treatment/Exercise - 07/25/20 0001      Lumbar Exercises: Standing   Other Standing Lumbar Exercises shoulder extension green theraband 3x10 3" holds, pull downs green theraband 3x10 3" holds - verbal cues and demonstration during exercise      Lumbar Exercises: Seated   Other Seated Lumbar Exercises seated ball exercises- static holds , marches 2x10 B- tolerated well ; self mobilization with tennis ball at wall to lumbar and thoracic paraspinals and right glute med - 5 minutes      Manual Therapy   Manual Therapy Soft tissue mobilization    Manual therapy comments all manual interventions performed independently of other intervention  Soft tissue mobilization not tolerated initially - transitioned to percussion gun and this was tolerated well with less pain noted afterwards to lumbar and thoracic paraspianls and right glute med                   PT Education - 07/25/20 0930    Education Details educated patient in self mobilization with tennis ball and to keep ball in car to help with pain throughout day during 12 hour shift. Educated in use of percussion gun and looking for a deal online. On HEP and goals of exercises    Person(s) Educated Patient    Methods Explanation    Comprehension Verbalized understanding            PT Short Term Goals - 07/17/20 0930      PT SHORT TERM GOAL #1   Title Patient will be independent with HEP in order to improve functional outcomes.    Time 3    Period Weeks    Status New    Target Date 08/07/20      PT SHORT TERM GOAL #2   Title Patient will report at least 25% improvement in  symptoms for improved quality of life.    Time 3    Period Weeks    Status New    Target Date 08/07/20             PT Long Term Goals - 07/17/20 0931      PT LONG TERM GOAL #1   Title Patient will report at least 75% improvement in symptoms for improved quality of life.    Time 6    Period Weeks    Status New    Target Date 08/28/20      PT LONG TERM GOAL #2   Title Patient will be able to complete 5x STS in under 11.4 seconds in order to reduce the risk of falls and demonstrate improved functional strength.    Time 6    Period Weeks    Status New    Target Date 08/28/20      PT LONG TERM GOAL #3   Title Patient will report centralized lumbar symptoms with symptoms no greater than 3/10 for improved ability to work.    Time 6    Period Weeks    Status New    Target Date 08/28/20                 Plan - 07/25/20 0931    Clinical Impression Statement Focused on posterior chain and core strengthening secondary to patient report of pain in upper and lower back. This was tolerated moderately well but continued pain noted. Trialed soft tissue mobilization which was not initially tolerated but transitioned to percussion gun and this was tolerated well with patient reported less pain afterwards. Educated patient in self mobilization with tennis ball. Patient with lots of stress and pain secondary to working 12 hour shifts and death of fianc and needing to save up money for her own place. Added posterior chain strengthening and this was tolerated well too. Reduced symptoms noted end of session.    Personal Factors and Comorbidities Behavior Pattern;Past/Current Experience;Education;Time since onset of injury/illness/exacerbation;Social Background;Fitness;Comorbidity 3+    Comorbidities anxiety, depression, chronic pain, HLD    Examination-Activity Limitations Bend;Lift;Sit;Continence;Bed Mobility    Examination-Participation Restrictions Occupation;Cleaning;Community  Activity;Volunteer;Yard Work;Shop    Stability/Clinical Decision Making Evolving/Moderate complexity    Rehab Potential Fair    PT Frequency 2x / week  PT Duration 6 weeks    PT Treatment/Interventions ADLs/Self Care Home Management;Aquatic Therapy;Canalith Repostioning;Cryotherapy;Electrical Stimulation;Biofeedback;Iontophoresis 4mg /ml Dexamethasone;Moist Heat;Traction;Ultrasound;DME Instruction;Gait training;Stair training;Functional mobility training;Therapeutic activities;Therapeutic exercise;Balance training;Neuromuscular re-education;Patient/family education;Orthotic Fit/Training;Manual techniques;Compression bandaging;Scar mobilization;Passive range of motion;Dry needling;Energy conservation;Splinting;Taping;Vestibular;Spinal Manipulations;Joint Manipulations    PT Next Visit Plan Complete FOTO, posterior chain and core strengthening,  possibly begin lumbar extension in standing, begin core strengthing, further assess hip strength and possibly add hip strengthening, postural strengthing. Further assess cervical and headache symptoms when able. Possibly manual for pain/mobility    PT Home Exercise Plan 10/27 POE; 11/4 self mobilization with tennis ball, shoulder pull downs and shoulder extension    Consulted and Agree with Plan of Care Patient           Patient will benefit from skilled therapeutic intervention in order to improve the following deficits and impairments:  Decreased range of motion, Decreased endurance, Increased muscle spasms, Decreased activity tolerance, Hypomobility, Pain, Impaired flexibility, Decreased balance, Decreased mobility, Decreased strength, Impaired sensation, Improper body mechanics  Visit Diagnosis: Low back pain, unspecified back pain laterality, unspecified chronicity, unspecified whether sciatica present  Cervicalgia  Muscle weakness (generalized)  Other abnormalities of gait and mobility  Other symptoms and signs involving the musculoskeletal  system     Problem List Patient Active Problem List   Diagnosis Date Noted  . RUQ pain 05/10/2020  . Dizziness 05/07/2020  . Facial numbness 05/07/2020  . Weakness of both lower extremities 05/07/2020  . Acute left-sided low back pain with left-sided sciatica 03/13/2020  . Overactive bladder 02/13/2020  . Acute pain of left knee 02/13/2020  . Left hip pain 02/13/2020  . Gastroesophageal reflux disease 02/13/2020  . Urgency of urination 02/13/2020  . Mixed hyperlipidemia 02/09/2020  . Vitamin D deficiency 12/07/2019  . Hypothyroidism 12/07/2019  . Iron deficiency 12/07/2019  . Depression, major, single episode, moderate (HCC) 12/07/2019  . Shortness of breath 12/07/2019  . Language barrier to communication 12/07/2019   9:38 AM, 07/25/20 13/04/21, DPT Physical Therapy with [facility]  [phone] office   [facility] [address] [place], [place], [place] Phone: [phone]   Fax:  [phone]  Name: DOAA KENDZIERSKI MRN: Fernand Parkins Date of Birth: [birth year]

## 2020-07-30 ENCOUNTER — Ambulatory Visit (HOSPITAL_COMMUNITY): Payer: Medicaid Other

## 2020-07-30 ENCOUNTER — Other Ambulatory Visit: Payer: Self-pay

## 2020-07-30 ENCOUNTER — Encounter (HOSPITAL_COMMUNITY): Payer: Self-pay

## 2020-07-30 DIAGNOSIS — M6281 Muscle weakness (generalized): Secondary | ICD-10-CM

## 2020-07-30 DIAGNOSIS — R29898 Other symptoms and signs involving the musculoskeletal system: Secondary | ICD-10-CM

## 2020-07-30 DIAGNOSIS — M545 Low back pain, unspecified: Secondary | ICD-10-CM

## 2020-07-30 DIAGNOSIS — M542 Cervicalgia: Secondary | ICD-10-CM

## 2020-07-30 DIAGNOSIS — R2689 Other abnormalities of gait and mobility: Secondary | ICD-10-CM

## 2020-07-30 NOTE — Therapy (Signed)
[facility] [address] [place], [place], [address] Phone: [phone]   Fax:  [phone]  Physical Therapy Treatment  Patient Details  Name: Jennifer Wright MRN: [id number] Date of Birth: 1965/12/03 Referring Provider (PT): Kendell Bane Dawley DO   Encounter Date: 07/30/2020   PT End of Session - 07/30/20 1550    Visit Number 3    Number of Visits 12    Date for PT Re-Evaluation 08/28/20    Authorization Type [employer] community health medicaid (no aurth required for pt's over 21, 27 comb visits for year)    Authorization - Visit Number 3    Authorization - Number of Visits 27    Progress Note Due on Visit 10    PT Start Time 1532    PT Stop Time 1612    PT Time Calculation (min) 40 min    Activity Tolerance Patient tolerated treatment well    Behavior During Therapy [facility] for tasks assessed/performed           Past Medical History:  Diagnosis Date  . Anxiety   . Asbestos exposure   . Asthma   . Depression   . Encounter for screening for malignant neoplasm of colon 12/07/2019  . Family history of diabetes mellitus 12/07/2019  . Family history of hyperlipidemia 12/07/2019  . Generalized headaches 12/07/2019  . GERD (gastroesophageal reflux disease)   . Hyperlipemia   . Neck and shoulder pain 12/07/2019  . Shortness of breath 12/07/2019  . Thyroid disease   . Upper back pain 12/07/2019    Past Surgical History:  Procedure Laterality Date  . ABDOMINAL HYSTERECTOMY    . TUBAL LIGATION      There were no vitals filed for this visit.   Subjective Assessment - 07/30/20 1534    Subjective Pt stated her mid back hurts in the middle, pain scale 6/10.  Reports she has ordered a massage gun, hasn't been to store to purchase tennis ball yet.    Patient Stated Goals to be able to work with the pain    Currently in Pain? Yes    Pain Score 6     Pain Location Back    Pain Orientation Mid;Medial    Pain Descriptors / Indicators Tightness;Aching     Pain Type Chronic pain    Pain Onset More than a month ago    Pain Frequency Constant              [facility] PT Assessment - 07/30/20 0001      Assessment   Medical Diagnosis Lumbar Spondylosis    Referring Provider (PT) Monia Pouch DO    Next MD Visit [facility] 07/31/20      Strength   Right Hip Extension 4/5    Right Hip ABduction 5/5    Left Hip Extension 3+/5    Left Hip ABduction 4+/5                         [facility] Adult PT Treatment/Exercise - 07/30/20 0001      Exercises   Exercises Lumbar      Lumbar Exercises: Stretches   Standing Extension 5 reps;5 seconds    Standing Extension Limitations 2 sets    Prone on Elbows Stretch Limitations 2 minutes    Press Ups 5 reps    Press Ups Limitations pain at end range      Lumbar Exercises: Standing   Other Standing Lumbar Exercises GTB  shoulder extension and rows 10x    Other Standing Lumbar Exercises theraband pull down      Lumbar Exercises: Seated   Other Seated Lumbar Exercises posterior shoulder rolls (Up, back and down)      Lumbar Exercises: Supine   Bridge 10 reps;5 seconds      Manual Therapy   Manual Therapy Soft tissue mobilization    Manual therapy comments Manual complete separate than rest of tx    Soft tissue mobilization Prone: STM to paraspinals and UT                    PT Short Term Goals - 07/17/20 0930      PT SHORT TERM GOAL #1   Title Patient will be independent with HEP in order to improve functional outcomes.    Time 3    Period Weeks    Status New    Target Date 08/07/20      PT SHORT TERM GOAL #2   Title Patient will report at least 25% improvement in symptoms for improved quality of life.    Time 3    Period Weeks    Status New    Target Date 08/07/20             PT Long Term Goals - 07/17/20 0931      PT LONG TERM GOAL #1   Title Patient will report at least 75% improvement in symptoms for improved quality of life.    Time 6    Period Weeks     Status New    Target Date 08/28/20      PT LONG TERM GOAL #2   Title Patient will be able to complete 5x STS in under 11.4 seconds in order to reduce the risk of falls and demonstrate improved functional strength.    Time 6    Period Weeks    Status New    Target Date 08/28/20      PT LONG TERM GOAL #3   Title Patient will report centralized lumbar symptoms with symptoms no greater than 3/10 for improved ability to work.    Time 6    Period Weeks    Status New    Target Date 08/28/20                 Plan - 07/30/20 1727    Clinical Impression Statement No intrepreter present, pt able to verbalize and understand all communication through session.  FOTO not needed per insurance.  Assessed hip strenghtening with noted glut max weakness.  Continued POC with lumbar mobility, core and proximal strengthening.  Pt stressed upon arrival due to working 12 hr days and recent death.  Pt educated on techniques to improve postural awareness and reduce UT activation.  EOS with manual soft tissue mobilization to address tightness for pain control with reports of some relief following.  Pt stated she has ordered massage gun for personal self care.    Personal Factors and Comorbidities Behavior Pattern;Past/Current Experience;Education;Time since onset of injury/illness/exacerbation;Social Background;Fitness;Comorbidity 3+    Comorbidities anxiety, depression, chronic pain, HLD    Examination-Activity Limitations Bend;Lift;Sit;Continence;Bed Mobility    Examination-Participation Restrictions Occupation;Cleaning;Community Activity;Volunteer;Yard Work;Shop    Stability/Clinical Decision Making Evolving/Moderate complexity    Clinical Decision Making Moderate    Rehab Potential Fair    PT Frequency 2x / week    PT Duration 6 weeks    PT Treatment/Interventions ADLs/Self Care Home Management;Aquatic Therapy;Canalith Repostioning;Cryotherapy;Electrical Stimulation;Biofeedback;Iontophoresis 4mg /ml  Dexamethasone;Moist  Heat;Traction;Ultrasound;DME Instruction;Gait training;Stair training;Functional mobility training;Therapeutic activities;Therapeutic exercise;Balance training;Neuromuscular re-education;Patient/family education;Orthotic Fit/Training;Manual techniques;Compression bandaging;Scar mobilization;Passive range of motion;Dry needling;Energy conservation;Splinting;Taping;Vestibular;Spinal Manipulations;Joint Manipulations    PT Next Visit Plan Continue posterior chain, core and postural strengthening.  Further assess cervical and handache symptoms when able.  Possibly manual for pain/mobility.    PT Home Exercise Plan 10/27 POE; 11/4 self mobilization with tennis ball, shoulder pull downs and shoulder extension           Patient will benefit from skilled therapeutic intervention in order to improve the following deficits and impairments:  Decreased range of motion, Decreased endurance, Increased muscle spasms, Decreased activity tolerance, Hypomobility, Pain, Impaired flexibility, Decreased balance, Decreased mobility, Decreased strength, Impaired sensation, Improper body mechanics  Visit Diagnosis: Low back pain, unspecified back pain laterality, unspecified chronicity, unspecified whether sciatica present  Cervicalgia  Muscle weakness (generalized)  Other abnormalities of gait and mobility  Other symptoms and signs involving the musculoskeletal system     Problem List Patient Active Problem List   Diagnosis Date Noted  . RUQ pain 05/10/2020  . Dizziness 05/07/2020  . Facial numbness 05/07/2020  . Weakness of both lower extremities 05/07/2020  . Acute left-sided low back pain with left-sided sciatica 03/13/2020  . Overactive bladder 02/13/2020  . Acute pain of left knee 02/13/2020  . Left hip pain 02/13/2020  . Gastroesophageal reflux disease 02/13/2020  . Urgency of urination 02/13/2020  . Mixed hyperlipidemia 02/09/2020  . Vitamin D deficiency 12/07/2019  .  Hypothyroidism 12/07/2019  . Iron deficiency 12/07/2019  . Depression, major, single episode, moderate (HCC) 12/07/2019  . Shortness of breath 12/07/2019  . Language barrier to communication 12/07/2019   Becky Sax, LPTA/CLT; CBIS [phone]  Juel Burrow 07/30/2020, 5:53 PM  [facility] [address] [place], [place], [address] Phone: [phone]   Fax:  [phone]  Name: Jennifer Wright MRN: [id number] Date of Birth: [birth year]

## 2020-07-31 ENCOUNTER — Encounter: Payer: Self-pay | Admitting: Family Medicine

## 2020-07-31 ENCOUNTER — Ambulatory Visit (HOSPITAL_COMMUNITY): Payer: Medicaid Other | Admitting: Physical Therapy

## 2020-07-31 ENCOUNTER — Ambulatory Visit (INDEPENDENT_AMBULATORY_CARE_PROVIDER_SITE_OTHER): Payer: Medicaid Other | Admitting: Family Medicine

## 2020-07-31 VITALS — BP 106/70 | HR 88 | Temp 97.5°F | Ht 61.0 in | Wt 122.0 lb

## 2020-07-31 DIAGNOSIS — G43009 Migraine without aura, not intractable, without status migrainosus: Secondary | ICD-10-CM | POA: Diagnosis not present

## 2020-07-31 DIAGNOSIS — Z23 Encounter for immunization: Secondary | ICD-10-CM | POA: Diagnosis not present

## 2020-07-31 DIAGNOSIS — Z1231 Encounter for screening mammogram for malignant neoplasm of breast: Secondary | ICD-10-CM

## 2020-07-31 DIAGNOSIS — Z124 Encounter for screening for malignant neoplasm of cervix: Secondary | ICD-10-CM | POA: Diagnosis not present

## 2020-07-31 NOTE — Progress Notes (Signed)
Subjective:  Patient ID: Jennifer Wright, female    DOB: Jun 15, 1966  Age: 54 y.o. MRN: [id number]  CC:  Chief Complaint  Patient presents with  . Follow-up  . Headache    intermittent migraines, has had one since yesterday, medication not helping      HPI  HPI  [name] is a 54 year old female patient of mine.  She presents today with interpreter for follow-up. reports that everything is doing well outside of her headaches.  She reports increase in stress secondary to loss of a loved one.  She reports she thinks this is why her headaches have become worse.  She usually can take Imitrex and they help but today her headache is just been going on for couple days without much relief.  She reports that she is willing to see a neurologist for this and declines getting any injections for pain today in the office.  She denies having any changes or trouble with chewing or swallowing.  Denies having any changes in her bathroom habits.  Is followed by GI now.  Denies having any memory changes.  Denies having any falls or injuries.  Denies any skin issues.  Denies have any hearing issues.  Does report some vision changes predominantly when her head is hurting.  Does not know what her blood pressure is doing when her head is hurting.  She is willing to get her flu shot today.  She is willing to have referral for mammogram and Pap smear.  Today patient denies signs and symptoms of COVID 19 infection including fever, chills, cough, shortness of breath, and headache. Past Medical, Surgical, Social History, Allergies, and Medications have been Reviewed.   Past Medical History:  Diagnosis Date  . Anxiety   . Asbestos exposure   . Asthma   . Depression   . Encounter for screening for malignant neoplasm of colon 12/07/2019  . Family history of diabetes mellitus 12/07/2019  . Family history of hyperlipidemia 12/07/2019  . Generalized headaches 12/07/2019  . GERD (gastroesophageal reflux disease)     . Hyperlipemia   . Neck and shoulder pain 12/07/2019  . Shortness of breath 12/07/2019  . Thyroid disease   . Upper back pain 12/07/2019    Current Meds  Medication Sig  . albuterol (VENTOLIN HFA) 108 (90 Base) MCG/ACT inhaler Inhale 1 puff into the lungs every 6 (six) hours as needed for wheezing or shortness of breath. Inhale 1 puff into the lungs every 6 (six) hours as needed for wheezing or shortness of breath.  . budesonide-formoterol (SYMBICORT) 160-4.5 MCG/ACT inhaler Inhale 2 puffs into the lungs daily.  . busPIRone (BUSPAR) 5 MG tablet Take 1 tablet (5 mg total) by mouth 2 (two) times daily.  . fesoterodine (TOVIAZ) 8 MG TB24 tablet Take 1 tablet (8 mg total) by mouth daily.  . hyoscyamine (LEVSIN) 0.125 MG tablet Take 1 tablet (0.125 mg total) by mouth every 6 (six) hours as needed for bladder spasms.  Marland Kitchen levothyroxine (SYNTHROID) 75 MCG tablet Take 1 tablet (75 mcg total) by mouth daily before breakfast.  . mirtazapine (REMERON) 15 MG tablet Take 15 mg by mouth at bedtime.  . naproxen (NAPROSYN) 500 MG tablet Take 1 tablet (500 mg total) by mouth 2 (two) times daily with a meal.  . omeprazole (PRILOSEC) 20 MG capsule Take 1 capsule (20 mg total) by mouth 2 (two) times daily before a meal.  . pantoprazole (PROTONIX) 40 MG tablet Take 1 tablet (40 mg  total) by mouth 2 (two) times daily before a meal.  . QUEtiapine (SEROQUEL) 50 MG tablet Take 100 mg by mouth at bedtime.  . rosuvastatin (CRESTOR) 10 MG tablet Take 1 tablet (10 mg total) by mouth daily.  . sertraline (ZOLOFT) 100 MG tablet Take 1 tablet (100 mg total) by mouth daily.  . SUMAtriptan (IMITREX) 100 MG tablet Take 1 tablet (100 mg total) by mouth every 2 (two) hours as needed for migraine. May repeat once in 2 hours if headache persists or recurs.  Marland Kitchen tiotropium (SPIRIVA) 18 MCG inhalation capsule Place 1 capsule (18 mcg total) into inhaler and inhale daily.  . Vitamin D, Ergocalciferol, (DRISDOL) 1.25 MG (50000 UNIT) CAPS  capsule Take 1 capsule (50,000 Units total) by mouth every 7 (seven) days.  . [DISCONTINUED] HYDROcodone-acetaminophen (NORCO/VICODIN) 5-325 MG tablet One tablet every six hours for pain.  Limit 7 days.    ROS:  Review of Systems  Constitutional: Negative.   HENT: Negative.   Eyes: Negative.   Respiratory: Negative.   Cardiovascular: Negative.   Gastrointestinal: Negative.   Genitourinary: Negative.   Musculoskeletal: Negative.   Skin: Negative.   Neurological: Positive for headaches.  Endo/Heme/Allergies: Negative.   Psychiatric/Behavioral: Negative.      Objective:   Today's Vitals: BP 106/70 (BP Location: Right Arm, Patient Position: Sitting, Cuff Size: Normal)   Pulse 88   Temp (!) 97.5 F (36.4 C) (Temporal)   Ht 5\' 1"  (1.549 m)   Wt 122 lb (55.3 kg)   SpO2 100%   BMI 23.05 kg/m  Vitals with BMI 07/31/2020 06/25/2020 06/03/2020  Height 5\' 1"  5\' 1"  -  Weight 122 lbs 127 lbs -  BMI 23.06 24.01 -  Systolic 106 119 06/05/2020  Diastolic 70 80 74  Pulse 88 75 71     Physical Exam Vitals and nursing note reviewed.  Constitutional:      Appearance: Normal appearance. She is well-developed, well-groomed and normal weight.  HENT:     Head: Normocephalic and atraumatic.     Right Ear: External ear normal.     Left Ear: External ear normal.     Nose: Nose normal.     Mouth/Throat:     Mouth: Mucous membranes are moist.     Pharynx: Oropharynx is clear.  Eyes:     General:        Right eye: No discharge.        Left eye: No discharge.     Conjunctiva/sclera: Conjunctivae normal.  Cardiovascular:     Rate and Rhythm: Normal rate and regular rhythm.     Pulses: Normal pulses.     Heart sounds: Normal heart sounds.  Pulmonary:     Effort: Pulmonary effort is normal.     Breath sounds: Normal breath sounds.  Musculoskeletal:        General: Normal range of motion.     Cervical back: Normal range of motion and neck supple.  Skin:    General: Skin is warm.    Neurological:     General: No focal deficit present.     Mental Status: She is alert and oriented to person, place, and time.  Psychiatric:        Attention and Perception: Attention normal.        Mood and Affect: Mood normal.        Speech: Speech normal.        Behavior: Behavior normal. Behavior is cooperative.  Thought Content: Thought content normal.        Cognition and Memory: Cognition normal.        Judgment: Judgment normal.     Assessment   1. Migraine without aura and without status migrainosus, not intractable   2. Need for immunization against influenza   3. Encounter for screening for malignant neoplasm of cervix   4. Encounter for screening mammogram for malignant neoplasm of breast     Tests ordered Orders Placed This Encounter  Procedures  . MM 3D SCREEN BREAST BILATERAL  . Flu Vaccine QUAD 36+ mos IM  . Ambulatory referral to Neurology  . Ambulatory referral to Obstetrics / Gynecology     Plan: Please see assessment and plan per problem list above.   No orders of the defined types were placed in this encounter.   Patient to follow-up in Visit date not found   Note: This dictation was prepared with Dragon dictation along with smaller phrase technology. Similar sounding words can be transcribed inadequately or may not be corrected upon review. Any transcriptional errors that result from this process are unintentional.      Freddy Finner, NP

## 2020-07-31 NOTE — Patient Instructions (Signed)
  HAPPY FALL!  I appreciate the opportunity to provide you with care for your health and wellness. Today we discussed: several concerns   Follow up: Aug of 2022 for CPE (check date from last)- fasting appt labs same day  No labs  Referrals today: GYN, Neurologist Orders: Mammogram  Tylenol 1,000 mg and Ibuprofen 400 mg (to see if that helps head)   Please continue to practice social distancing to keep you, your family, and our community safe.  If you must go out, please wear a mask and practice good handwashing.  It was a pleasure to see you and I look forward to continuing to work together on your health and well-being. Please do not hesitate to call the office if you need care or have questions about your care.  Have a wonderful day and week. With Gratitude, Tereasa Coop, DNP, AGNP-BC

## 2020-08-01 DIAGNOSIS — Z124 Encounter for screening for malignant neoplasm of cervix: Secondary | ICD-10-CM | POA: Insufficient documentation

## 2020-08-01 DIAGNOSIS — G43009 Migraine without aura, not intractable, without status migrainosus: Secondary | ICD-10-CM | POA: Insufficient documentation

## 2020-08-01 DIAGNOSIS — Z23 Encounter for immunization: Secondary | ICD-10-CM | POA: Insufficient documentation

## 2020-08-01 DIAGNOSIS — Z1231 Encounter for screening mammogram for malignant neoplasm of breast: Secondary | ICD-10-CM | POA: Insufficient documentation

## 2020-08-01 NOTE — Assessment & Plan Note (Signed)
Declines medication change.  Declines having injection today for pain.  Referral to neurology.

## 2020-08-01 NOTE — Assessment & Plan Note (Signed)

## 2020-08-02 ENCOUNTER — Encounter: Payer: Self-pay | Admitting: Neurology

## 2020-08-05 ENCOUNTER — Telehealth (HOSPITAL_COMMUNITY): Payer: Self-pay | Admitting: Physical Therapy

## 2020-08-05 ENCOUNTER — Ambulatory Visit (HOSPITAL_COMMUNITY): Payer: Medicaid Other | Admitting: Physical Therapy

## 2020-08-05 NOTE — Telephone Encounter (Signed)
No Show. Called and voicemail box not set up. Unable to leave message about missed appointment and future appointment.   4:59 PM, 08/05/20 Tereasa Coop, DPT Physical Therapy with [facility]  [phone] office

## 2020-08-07 ENCOUNTER — Encounter (HOSPITAL_COMMUNITY): Payer: Self-pay | Admitting: Physical Therapy

## 2020-08-07 ENCOUNTER — Other Ambulatory Visit: Payer: Self-pay

## 2020-08-07 ENCOUNTER — Ambulatory Visit (HOSPITAL_COMMUNITY): Payer: Medicaid Other | Admitting: Physical Therapy

## 2020-08-07 DIAGNOSIS — R2689 Other abnormalities of gait and mobility: Secondary | ICD-10-CM

## 2020-08-07 DIAGNOSIS — R29898 Other symptoms and signs involving the musculoskeletal system: Secondary | ICD-10-CM

## 2020-08-07 DIAGNOSIS — M545 Low back pain, unspecified: Secondary | ICD-10-CM | POA: Diagnosis not present

## 2020-08-07 DIAGNOSIS — M542 Cervicalgia: Secondary | ICD-10-CM

## 2020-08-07 DIAGNOSIS — M6281 Muscle weakness (generalized): Secondary | ICD-10-CM

## 2020-08-07 NOTE — Therapy (Signed)
[facility] [address] [place], [place], [address] Phone: [phone]   Fax:  [phone]  Physical Therapy Treatment  Patient Details  Name: Jennifer Wright MRN: [id number] Date of Birth: 03/27/1966 Referring Provider (PT): Kendell Bane Dawley DO   Encounter Date: 08/07/2020   PT End of Session - 08/07/20 0836    Visit Number 4    Number of Visits 12    Date for PT Re-Evaluation 08/28/20    Authorization Type [employer] community health medicaid (no aurth required for pt's over 21, 27 comb visits for year)    Authorization - Visit Number 4    Authorization - Number of Visits 27    Progress Note Due on Visit 10    PT Start Time [phone]    PT Stop Time 0915    PT Time Calculation (min) 40 min    Activity Tolerance Patient tolerated treatment well    Behavior During Therapy [facility] for tasks assessed/performed           Past Medical History:  Diagnosis Date  . Anxiety   . Asbestos exposure   . Asthma   . Depression   . Encounter for screening for malignant neoplasm of colon 12/07/2019  . Family history of diabetes mellitus 12/07/2019  . Family history of hyperlipidemia 12/07/2019  . Generalized headaches 12/07/2019  . GERD (gastroesophageal reflux disease)   . Hyperlipemia   . Neck and shoulder pain 12/07/2019  . Shortness of breath 12/07/2019  . Thyroid disease   . Upper back pain 12/07/2019    Past Surgical History:  Procedure Laterality Date  . ABDOMINAL HYSTERECTOMY    . TUBAL LIGATION      There were no vitals filed for this visit.   Subjective Assessment - 08/07/20 0837    Subjective Patient states her mid back pain is still there. And she is having pain in her foot and ankle which seems unrelated to her back. She is using insoles but it hurts worse. Her exercises are going alright. The scap retractions made her back feel burning. She will f/u with neurologist for her neck and headaches.    Patient is accompained by: Interpreter     Patient Stated Goals to be able to work with the pain    Currently in Pain? Yes    Pain Score 5     Pain Location Back    Pain Orientation Mid    Pain Descriptors / Indicators Tightness;Aching    Pain Onset More than a month ago                             [facility] Adult PT Treatment/Exercise - 08/07/20 0001      Lumbar Exercises: Stretches   Other Lumbar Stretch Exercise thoracic extension over chair 10x 5 second holds      Lumbar Exercises: Standing   Row AROM;Both;10 reps    Theraband Level (Row) Level 3 (Green)    Row Limitations 2 sets     Shoulder Extension AROM;Both;10 reps    Theraband Level (Shoulder Extension) Level 3 (Green)    Shoulder Extension Limitations 2 sets      Other Standing Lumbar Exercises scapular retraction with GH ER 2x10       Lumbar Exercises: Sidelying   Other Sidelying Lumbar Exercises open book 10 x 5 second holds bilateral  PT Education - 08/07/20 0836    Education Details Patient educated on HEP, exercise mechanics    Person(s) Educated Patient    Methods Explanation;Demonstration    Comprehension Verbalized understanding;Returned demonstration            PT Short Term Goals - 07/17/20 0930      PT SHORT TERM GOAL #1   Title Patient will be independent with HEP in order to improve functional outcomes.    Time 3    Period Weeks    Status New    Target Date 08/07/20      PT SHORT TERM GOAL #2   Title Patient will report at least 25% improvement in symptoms for improved quality of life.    Time 3    Period Weeks    Status New    Target Date 08/07/20             PT Long Term Goals - 07/17/20 0931      PT LONG TERM GOAL #1   Title Patient will report at least 75% improvement in symptoms for improved quality of life.    Time 6    Period Weeks    Status New    Target Date 08/28/20      PT LONG TERM GOAL #2   Title Patient will be able to complete 5x STS in under 11.4 seconds in order  to reduce the risk of falls and demonstrate improved functional strength.    Time 6    Period Weeks    Status New    Target Date 08/28/20      PT LONG TERM GOAL #3   Title Patient will report centralized lumbar symptoms with symptoms no greater than 3/10 for improved ability to work.    Time 6    Period Weeks    Status New    Target Date 08/28/20                 Plan - 08/07/20 0836    Clinical Impression Statement Patient tolerates thoracic extension over chair with minimal stretch in upper back. Patient requires verbal and tactile cueing for reducing UT activation with postural strengthening. Patient completes scap retractions with glenohumeral ER at wall for postural cueing. Patient feeling improvement in symptoms at end of session. Patient will continue to benefit from skilled physical therapy in order to reduce impairment and improve function.    Personal Factors and Comorbidities Behavior Pattern;Past/Current Experience;Education;Time since onset of injury/illness/exacerbation;Social Background;Fitness;Comorbidity 3+    Comorbidities anxiety, depression, chronic pain, HLD    Examination-Activity Limitations Bend;Lift;Sit;Continence;Bed Mobility    Examination-Participation Restrictions Occupation;Cleaning;Community Activity;Volunteer;Yard Work;Shop    Stability/Clinical Decision Making Evolving/Moderate complexity    Rehab Potential Fair    PT Frequency 2x / week    PT Duration 6 weeks    PT Treatment/Interventions ADLs/Self Care Home Management;Aquatic Therapy;Canalith Repostioning;Cryotherapy;Electrical Stimulation;Biofeedback;Iontophoresis 4mg /ml Dexamethasone;Moist Heat;Traction;Ultrasound;DME Instruction;Gait training;Stair training;Functional mobility training;Therapeutic activities;Therapeutic exercise;Balance training;Neuromuscular re-education;Patient/family education;Orthotic Fit/Training;Manual techniques;Compression bandaging;Scar mobilization;Passive range of  motion;Dry needling;Energy conservation;Splinting;Taping;Vestibular;Spinal Manipulations;Joint Manipulations    PT Next Visit Plan Continue posterior chain, core and postural strengthening.  Further assess cervical and handache symptoms when able.  Possibly manual for pain/mobility.    PT Home Exercise Plan 10/27 POE; 11/4 self mobilization with tennis ball, shoulder pull downs and shoulder extension 11/17 Row, ext, t/sp ext           Patient will benefit from skilled therapeutic intervention in order to improve the following deficits and impairments:  Decreased range of motion, Decreased endurance, Increased muscle spasms, Decreased activity tolerance, Hypomobility, Pain, Impaired flexibility, Decreased balance, Decreased mobility, Decreased strength, Impaired sensation, Improper body mechanics  Visit Diagnosis: Low back pain, unspecified back pain laterality, unspecified chronicity, unspecified whether sciatica present  Cervicalgia  Muscle weakness (generalized)  Other abnormalities of gait and mobility  Other symptoms and signs involving the musculoskeletal system     Problem List Patient Active Problem List   Diagnosis Date Noted  . Migraine without aura and without status migrainosus, not intractable 08/01/2020  . Need for immunization against influenza 08/01/2020  . Encounter for screening for malignant neoplasm of cervix 08/01/2020  . Encounter for screening mammogram for malignant neoplasm of breast 08/01/2020  . RUQ pain 05/10/2020  . Dizziness 05/07/2020  . Facial numbness 05/07/2020  . Weakness of both lower extremities 05/07/2020  . Acute left-sided low back pain with left-sided sciatica 03/13/2020  . Overactive bladder 02/13/2020  . Acute pain of left knee 02/13/2020  . Left hip pain 02/13/2020  . Gastroesophageal reflux disease 02/13/2020  . Urgency of urination 02/13/2020  . Mixed hyperlipidemia 02/09/2020  . Vitamin D deficiency 12/07/2019  . Hypothyroidism  12/07/2019  . Iron deficiency 12/07/2019  . Depression, major, single episode, moderate (HCC) 12/07/2019  . Shortness of breath 12/07/2019  . Language barrier to communication 12/07/2019    9:16 AM, 08/07/20 Wyman Songster PT, DPT Physical Therapist at [facility]   [facility] [address] [place], [place], [address] Phone: [phone]   Fax:  [phone]  Name: LACE CHENEVERT MRN: [id number] Date of Birth: 1966/01/04

## 2020-08-07 NOTE — Patient Instructions (Signed)
Access Code: [email or web address] URL: [email or web address] Date: 08/07/2020 Prepared by: Alonna Minium  Exercises Seated Thoracic Lumbar Extension with Pectoralis Stretch - 1 x daily - 7 x weekly - 10 reps - 5 second hold Standing Shoulder Row with Anchored Resistance - 1 x daily - 7 x weekly - 2 sets - 10 reps Shoulder extension with resistance - Neutral - 1 x daily - 7 x weekly - 2 sets - 10 reps

## 2020-08-10 ENCOUNTER — Other Ambulatory Visit: Payer: Self-pay | Admitting: Family Medicine

## 2020-08-10 DIAGNOSIS — E039 Hypothyroidism, unspecified: Secondary | ICD-10-CM

## 2020-08-12 ENCOUNTER — Encounter (HOSPITAL_COMMUNITY): Payer: Medicaid Other | Admitting: Physical Therapy

## 2020-08-13 ENCOUNTER — Telehealth (HOSPITAL_COMMUNITY): Payer: Self-pay | Admitting: Physical Therapy

## 2020-08-13 ENCOUNTER — Ambulatory Visit (HOSPITAL_COMMUNITY): Payer: Medicaid Other | Admitting: Physical Therapy

## 2020-08-13 NOTE — Telephone Encounter (Signed)
No Show Monday 11/22 and 11/23. Called patient but voicemail not set up to leave message about missed appointment.   3:55 PM, 08/13/20 Tereasa Coop, DPT Physical Therapy with [facility]  [phone] office

## 2020-08-17 ENCOUNTER — Other Ambulatory Visit: Payer: Self-pay

## 2020-08-17 ENCOUNTER — Encounter (HOSPITAL_COMMUNITY): Payer: Self-pay | Admitting: *Deleted

## 2020-08-17 ENCOUNTER — Emergency Department (HOSPITAL_COMMUNITY)
Admission: EM | Admit: 2020-08-17 | Discharge: 2020-08-17 | Disposition: A | Discharge status: Home / Self Care | Payer: Medicaid Other | Attending: Emergency Medicine | Admitting: Emergency Medicine

## 2020-08-17 DIAGNOSIS — R110 Nausea: Secondary | ICD-10-CM | POA: Diagnosis not present

## 2020-08-17 DIAGNOSIS — R197 Diarrhea, unspecified: Secondary | ICD-10-CM | POA: Diagnosis not present

## 2020-08-17 DIAGNOSIS — R1011 Right upper quadrant pain: Secondary | ICD-10-CM | POA: Insufficient documentation

## 2020-08-17 DIAGNOSIS — J45909 Unspecified asthma, uncomplicated: Secondary | ICD-10-CM | POA: Insufficient documentation

## 2020-08-17 DIAGNOSIS — T43205A Adverse effect of unspecified antidepressants, initial encounter: Secondary | ICD-10-CM | POA: Diagnosis not present

## 2020-08-17 DIAGNOSIS — R42 Dizziness and giddiness: Secondary | ICD-10-CM | POA: Insufficient documentation

## 2020-08-17 DIAGNOSIS — Z79899 Other long term (current) drug therapy: Secondary | ICD-10-CM | POA: Diagnosis not present

## 2020-08-17 DIAGNOSIS — R531 Weakness: Secondary | ICD-10-CM | POA: Diagnosis not present

## 2020-08-17 DIAGNOSIS — E039 Hypothyroidism, unspecified: Secondary | ICD-10-CM | POA: Insufficient documentation

## 2020-08-17 DIAGNOSIS — Z7951 Long term (current) use of inhaled steroids: Secondary | ICD-10-CM | POA: Insufficient documentation

## 2020-08-17 MED ORDER — MIRTAZAPINE 15 MG PO TABS: 15.0000 mg | ORAL_TABLET | Freq: Every day | ORAL | 0 refills | Status: DC

## 2020-08-17 MED ORDER — ONDANSETRON HCL 4 MG PO TABS: 4.0000 mg | ORAL_TABLET | Freq: Four times a day (QID) | ORAL | 0 refills | Status: DC

## 2020-08-17 MED ORDER — ONDANSETRON HCL 4 MG PO TABS
4.0000 mg | ORAL_TABLET | Freq: Once | ORAL | Status: AC
  Administered 2020-08-17: 4 mg via ORAL
  Filled 2020-08-17: qty 1

## 2020-08-17 MED ORDER — QUETIAPINE FUMARATE 100 MG PO TABS: 100.0000 mg | ORAL_TABLET | Freq: Every day | ORAL | 0 refills | Status: DC

## 2020-08-17 NOTE — ED Notes (Signed)
Pt reports she take meds for depression prescribed by Dr Andria Meuse   She has been assigned a new doctor and has an apppt in Dec but does not know when   She is encouraged to call Monday to office to report out of meds and see if she can get meds until seen by new provider

## 2020-08-17 NOTE — Discharge Instructions (Addendum)
You came to the hospital today with complaint of nausea, dizziness and diarrhea.  The symptoms are most likely due to a withdrawal from abruptly stopping your Remeron and Seroquel.  As we discussed I have sent a 1 month prescription of both of these to your pharmacy.  You must follow-up with your psychiatrist for further management of these medications.  I have also prescribed you with Zofran to help with your nausea.    Please return to the hospital if:  You have chest pain. You feel extremely weak or you faint. You have bloody or black stools or stools that look like tar. You have severe pain, cramping, or bloating in your abdomen. You have trouble breathing or you are breathing very quickly. Your heart is beating very quickly. Your skin feels cold and clammy. You feel confused. You have signs of dehydration, such as: Dark urine, very little urine, or no urine. Cracked lips. Dry mouth. Sunken eyes. Sleepiness. Weakness.

## 2020-08-17 NOTE — ED Triage Notes (Signed)
Pt ran out of medications Remeron and Seroquel for past 6 days.  Nausea and dizziness x 3-4 days and diarrhea since yesterday morning. Mild abd pain .  Pt states she has been unable to sleep for several nights.

## 2020-08-17 NOTE — ED Notes (Signed)
Has run out of her Alfa 2 receptor antagonist and anti psychotic   None of these in the last 6 days   Complaint of dizzy, N, and D x 2 today

## 2020-08-17 NOTE — ED Provider Notes (Signed)
[facility] EMERGENCY DEPARTMENT Provider Note   CSN: [id number] Arrival date & time: 08/17/20  1726     History Chief Complaint  Patient presents with  . Diarrhea    Jennifer Wright is a 54 y.o. female has a history of anxiety, depression, and migraine.  Patient presents to the emergency department today with a chief complaint of dizziness, nausea, generalized weakness, and diarrhea.  Patient reports that her dizziness, nausea, generalized weakness began 5 days prior, have been constant, have remained unchanged.  Patient endorses an episode of vomiting yesterday, denies bright red blood or coffee-ground emesis.  atient reports her headache has improved with Imitrex.  Patient denies any fever, abdominal pain, chest pain, or shortness of breath.    Patient reports that she ran out of Remeron and Seroquel medication 6 days prior.  Patient contacted her psychiatrist and was told that her doctor had changed and she has a appointment with her new provider in December.  She was unable to get a refill for her prescriptions.  Information from this interview was collected from the patient.  An interpreter was used.  HPI     Past Medical History:  Diagnosis Date  . Anxiety   . Asbestos exposure   . Asthma   . Depression   . Encounter for screening for malignant neoplasm of colon 12/07/2019  . Family history of diabetes mellitus 12/07/2019  . Family history of hyperlipidemia 12/07/2019  . Generalized headaches 12/07/2019  . GERD (gastroesophageal reflux disease)   . Hyperlipemia   . Neck and shoulder pain 12/07/2019  . Shortness of breath 12/07/2019  . Thyroid disease   . Upper back pain 12/07/2019    Patient Active Problem List   Diagnosis Date Noted  . Migraine without aura and without status migrainosus, not intractable 08/01/2020  . Need for immunization against influenza 08/01/2020  . Encounter for screening for malignant neoplasm of cervix 08/01/2020  . Encounter for screening  mammogram for malignant neoplasm of breast 08/01/2020  . RUQ pain 05/10/2020  . Dizziness 05/07/2020  . Facial numbness 05/07/2020  . Weakness of both lower extremities 05/07/2020  . Acute left-sided low back pain with left-sided sciatica 03/13/2020  . Overactive bladder 02/13/2020  . Acute pain of left knee 02/13/2020  . Left hip pain 02/13/2020  . Gastroesophageal reflux disease 02/13/2020  . Urgency of urination 02/13/2020  . Mixed hyperlipidemia 02/09/2020  . Vitamin D deficiency 12/07/2019  . Hypothyroidism 12/07/2019  . Iron deficiency 12/07/2019  . Depression, major, single episode, moderate (HCC) 12/07/2019  . Shortness of breath 12/07/2019  . Language barrier to communication 12/07/2019    Past Surgical History:  Procedure Laterality Date  . ABDOMINAL HYSTERECTOMY    . TUBAL LIGATION       OB History    Gravida  6   Para      Term      Preterm      AB      Living  6     SAB      TAB      Ectopic      Multiple      Live Births  6           Family History  Problem Relation Age of Onset  . Cancer Mother        stomach cancer  . Cancer Brother        stomach cancer  . Colon cancer Neg Hx   . Colon polyps Neg  Hx     Social History   Tobacco Use  . Smoking status: Never Smoker  . Smokeless tobacco: Never Used  Vaping Use  . Vaping Use: Never used  Substance Use Topics  . Alcohol use: Yes    Comment: socially  . Drug use: Never    Home Medications Prior to Admission medications   Medication Sig Start Date End Date Taking? Authorizing Provider  albuterol (VENTOLIN HFA) 108 (90 Base) MCG/ACT inhaler Inhale 1 puff into the lungs every 6 (six) hours as needed for wheezing or shortness of breath. Inhale 1 puff into the lungs every 6 (six) hours as needed for wheezing or shortness of breath. 02/20/20   Freddy Finner, NP  budesonide-formoterol [facility]) 160-4.5 MCG/ACT inhaler Inhale 2 puffs into the lungs daily. 02/20/20   Freddy Finner,  NP  busPIRone (BUSPAR) 5 MG tablet Take 1 tablet (5 mg total) by mouth 2 (two) times daily. 05/07/20   Freddy Finner, NP  fesoterodine (TOVIAZ) 8 MG TB24 tablet Take 1 tablet (8 mg total) by mouth daily. 06/25/20   Marcine Matar, MD  hyoscyamine (LEVSIN) 0.125 MG tablet Take 1 tablet (0.125 mg total) by mouth every 6 (six) hours as needed for bladder spasms. 06/17/20   Freddy Finner, NP  levothyroxine (SYNTHROID) 75 MCG tablet Take 1 tablet (75 mcg total) by mouth daily before breakfast. 04/22/20   Freddy Finner, NP  mirtazapine (REMERON) 15 MG tablet Take 1 tablet (15 mg total) by mouth at bedtime. 08/17/20   Haskel Schroeder, PA-C  naproxen (NAPROSYN) 500 MG tablet Take 1 tablet (500 mg total) by mouth 2 (two) times daily with a meal. 03/28/20   Darreld Mclean, MD  omeprazole (PRILOSEC) 20 MG capsule Take 1 capsule (20 mg total) by mouth 2 (two) times daily before a meal. 03/13/20   Freddy Finner, NP  ondansetron (ZOFRAN) 4 MG tablet Take 1 tablet (4 mg total) by mouth every 6 (six) hours. 08/17/20   Haskel Schroeder, PA-C  pantoprazole (PROTONIX) 40 MG tablet Take 1 tablet (40 mg total) by mouth 2 (two) times daily before a meal. 05/10/20   Gelene Mink, NP  QUEtiapine (SEROQUEL) 100 MG tablet Take 1 tablet (100 mg total) by mouth at bedtime. 08/17/20   Haskel Schroeder, PA-C  rosuvastatin (CRESTOR) 10 MG tablet Take 1 tablet (10 mg total) by mouth daily. 05/07/20   Freddy Finner, NP  sertraline (ZOLOFT) 100 MG tablet Take 1 tablet (100 mg total) by mouth daily. 05/07/20   Freddy Finner, NP  SUMAtriptan (IMITREX) 100 MG tablet Take 1 tablet (100 mg total) by mouth every 2 (two) hours as needed for migraine. May repeat once in 2 hours if headache persists or recurs. 07/18/20   Freddy Finner, NP  tiotropium (SPIRIVA) 18 MCG inhalation capsule Place 1 capsule (18 mcg total) into inhaler and inhale daily. 02/20/20   Freddy Finner, NP  Vitamin D, Ergocalciferol, (DRISDOL) 1.25 MG  (50000 UNIT) CAPS capsule Take 1 capsule (50,000 Units total) by mouth every 7 (seven) days. 03/13/20   Freddy Finner, NP    Allergies    Patient has no known allergies.  Review of Systems   Review of Systems  Constitutional: Positive for fatigue. Negative for chills and fever.  Eyes: Negative for visual disturbance.  Respiratory: Negative for shortness of breath.   Cardiovascular: Negative for chest pain.  Gastrointestinal: Positive for diarrhea, nausea and vomiting. Negative for  abdominal distention, abdominal pain and blood in stool.  Genitourinary: Negative for difficulty urinating and dysuria.  Musculoskeletal: Negative for back pain and neck pain.  Skin: Negative for color change and rash.  Neurological: Positive for dizziness, weakness (Generalized), light-headedness and headaches. Negative for seizures and syncope.  Psychiatric/Behavioral: Negative for confusion.    Physical Exam Updated Vital Signs BP 119/64 (BP Location: Left Arm)   Pulse 67   Temp 98.2 F (36.8 C) (Oral)   Resp 18   Ht 5\' 1"  (1.549 m)   Wt 56.7 kg   SpO2 97%   BMI 23.62 kg/m   Physical Exam Constitutional:      General: She is not in acute distress.    Appearance: She is not ill-appearing, toxic-appearing or diaphoretic.  HENT:     Head: Normocephalic.  Eyes:     General: Vision grossly intact.     Pupils: Pupils are equal, round, and reactive to light.  Cardiovascular:     Rate and Rhythm: Normal rate and regular rhythm.     Heart sounds: Normal heart sounds.  Pulmonary:     Effort: Pulmonary effort is normal.     Breath sounds: Normal breath sounds.  Abdominal:     General: Abdomen is flat. There is no distension.     Palpations: Abdomen is soft.     Tenderness: There is abdominal tenderness in the right upper quadrant. There is no guarding or rebound. Negative signs include Murphy's sign, Rovsing's sign and McBurney's sign.  Skin:    General: Skin is warm and dry.  Neurological:      General: No focal deficit present.     Mental Status: She is alert.     ED Results / Procedures / Treatments   Labs (all labs ordered are listed, but only abnormal results are displayed) Labs Reviewed - No data to display  EKG None  Radiology No results found.  Procedures Procedures (including critical care time)  Medications Ordered in ED Medications  ondansetron (ZOFRAN) tablet 4 mg (4 mg Oral Given 08/17/20 1933)    ED Course  I have reviewed the triage vital signs and the nursing notes.  Pertinent labs & imaging results that were available during my care of the patient were reviewed by me and considered in my medical decision making (see chart for details).    MDM Rules/Calculators/A&P                          Patient is an alert 54 year old female in no acute distress.  Patient has a history of anxiety, depression, and migraines.  Presents with chief complaints of nausea, headaches, generalized weakness and diarrhea.  Symptoms began after abruptly stopping Remeron and Seroquel.  Patient denies any fever, abdominal pain, hematemesis, coffee-ground emesis, blood in stool, chest pain, or shortness of breath.  Patient's physical exam is unremarkable except for some mild tenderness to palpation of mid to upper right abdomen.  Patient's abdomen shows no signs of peritonitis, no rebound tenderness, no pain over McBurney's point, no Rovsing sign.    Patient's vitals are all within normal limits and nonconcerning.  Patient symptoms are most likely due to antidepressant discontinuation.  Patient reports that she will not be able to get a refill of her medication until she can see her new psychiatrist, she does have an appointment scheduled for the month of December.  Patient was given refills of her Remeron and Seroquel.  She denied a dose  of Seroquel today in the emergency department because she needed to drive home and cannot take a sedating medication.  Patient was given a dose of  Zofran to help with her nausea.  She was also sent a prescription for Zofran.      Patient was told to follow-up with her psychiatrist.  She was given strict return precautions. Patient expressed understanding.      Final Clinical Impression(s) / ED Diagnoses Final diagnoses:  Nausea  Diarrhea, unspecified type  Antidepressant discontinuation syndrome, initial encounter    Rx / DC Orders ED Discharge Orders         Ordered    ondansetron (ZOFRAN) 4 MG tablet  Every 6 hours        08/17/20 1946    QUEtiapine (SEROQUEL) 100 MG tablet  Daily at bedtime        08/17/20 1946    mirtazapine (REMERON) 15 MG tablet  Daily at bedtime        08/17/20 1946           Berneice Heinrich 08/18/20 0029    Eber Hong, MD 08/19/20 1606

## 2020-08-19 ENCOUNTER — Other Ambulatory Visit: Payer: Self-pay

## 2020-08-19 ENCOUNTER — Ambulatory Visit (HOSPITAL_COMMUNITY): Payer: Medicaid Other | Admitting: Physical Therapy

## 2020-08-19 ENCOUNTER — Encounter (HOSPITAL_COMMUNITY): Payer: Self-pay | Admitting: Physical Therapy

## 2020-08-19 DIAGNOSIS — M545 Low back pain, unspecified: Secondary | ICD-10-CM | POA: Diagnosis not present

## 2020-08-19 DIAGNOSIS — R29898 Other symptoms and signs involving the musculoskeletal system: Secondary | ICD-10-CM

## 2020-08-19 DIAGNOSIS — R2689 Other abnormalities of gait and mobility: Secondary | ICD-10-CM

## 2020-08-19 DIAGNOSIS — M6281 Muscle weakness (generalized): Secondary | ICD-10-CM

## 2020-08-19 DIAGNOSIS — M542 Cervicalgia: Secondary | ICD-10-CM

## 2020-08-19 NOTE — Therapy (Signed)
[facility] [address] [place], [place], [address] Phone: [phone]   Fax:  [phone]  Physical Therapy Treatment  Patient Details  Name: Jennifer Wright MRN: [id number] Date of Birth: [birth year] Referring Provider (PT): Kendell Bane Dawley DO   Encounter Date: 08/19/2020   PT End of Session - 08/19/20 0830    Visit Number 5    Number of Visits 12    Date for PT Re-Evaluation 08/28/20    Authorization Type [employer] community health medicaid (no aurth required for pt's over 21, 27 comb visits for year)    Authorization - Visit Number 5    Authorization - Number of Visits 27    Progress Note Due on Visit 10    PT Start Time [phone]    PT Stop Time 0913    PT Time Calculation (min) 41 min    Activity Tolerance Patient tolerated treatment well    Behavior During Therapy [facility] for tasks assessed/performed           Past Medical History:  Diagnosis Date  . Anxiety   . Asbestos exposure   . Asthma   . Depression   . Encounter for screening for malignant neoplasm of colon 12/07/2019  . Family history of diabetes mellitus 12/07/2019  . Family history of hyperlipidemia 12/07/2019  . Generalized headaches 12/07/2019  . GERD (gastroesophageal reflux disease)   . Hyperlipemia   . Neck and shoulder pain 12/07/2019  . Shortness of breath 12/07/2019  . Thyroid disease   . Upper back pain 12/07/2019    Past Surgical History:  Procedure Laterality Date  . ABDOMINAL HYSTERECTOMY    . TUBAL LIGATION      There were no vitals filed for this visit.   Subjective Assessment - 08/19/20 0832    Subjective She has been working really hard lately. She was really tired on Saturday and ended up having to go to the hospital because she was not taking her medications as she should. Her prescription was sent to the wrong pharmacy and by a provider not covered by insurance and she has been having issues trying to get it. She was supposed to get an MRI but it was  denied. Her back is feeling a little a little better.    Patient is accompained by: Interpreter    Patient Stated Goals to be able to work with the pain    Currently in Pain? Yes    Pain Score 5     Pain Location Back    Pain Orientation Mid;Upper    Pain Descriptors / Indicators Tightness    Pain Type Chronic pain    Pain Onset More than a month ago                             [facility] Adult PT Treatment/Exercise - 08/19/20 0001      Lumbar Exercises: Standing   Other Standing Lumbar Exercises scapular protraction retraction with arms on wall 2 x10      Lumbar Exercises: Prone   Other Prone Lumbar Exercises prone shoulder extension and horzontial abduction 2x 10 each R shoulder      Manual Therapy   Manual Therapy Soft tissue mobilization;Joint mobilization    Manual therapy comments Manual complete separate than rest of tx    Joint Mobilization R UPA T2-T7 and R Ribs 2-7    Soft tissue mobilization Prone: STM to paraspinals and UT R  middle trap, rhomboids                  PT Education - 08/19/20 0831    Education Details Patient educated on HEP, exercise mechanics    Person(s) Educated Patient    Methods Explanation;Demonstration    Comprehension Verbalized understanding;Returned demonstration            PT Short Term Goals - 07/17/20 0930      PT SHORT TERM GOAL #1   Title Patient will be independent with HEP in order to improve functional outcomes.    Time 3    Period Weeks    Status New    Target Date 08/07/20      PT SHORT TERM GOAL #2   Title Patient will report at least 25% improvement in symptoms for improved quality of life.    Time 3    Period Weeks    Status New    Target Date 08/07/20             PT Long Term Goals - 07/17/20 0931      PT LONG TERM GOAL #1   Title Patient will report at least 75% improvement in symptoms for improved quality of life.    Time 6    Period Weeks    Status New    Target Date 08/28/20       PT LONG TERM GOAL #2   Title Patient will be able to complete 5x STS in under 11.4 seconds in order to reduce the risk of falls and demonstrate improved functional strength.    Time 6    Period Weeks    Status New    Target Date 08/28/20      PT LONG TERM GOAL #3   Title Patient will report centralized lumbar symptoms with symptoms no greater than 3/10 for improved ability to work.    Time 6    Period Weeks    Status New    Target Date 08/28/20                 Plan - 08/19/20 0831    Clinical Impression Statement Patient continues to have most pain in R periscapular region. Patient with hyperactive and tender rhomboids, middle lower trap, infraspinatus, lats, and teres minor/major on R. Patient states improvement in symptoms following manual therapy which patient tolerates well. Patient educated on performing self STM with tennis ball to further reduce symptoms at home. Patient has great difficulty with mechanics of scapular protraction/retraction secondary to impaired motor control. She requires extensive verbal, tactile, and manual cueing to complete with fair carryover. She completes prone series for improved periscapular strengthening and motor control. Patient will continue to benefit from skilled physical therapy in order to reduce impairment and improve function.    Personal Factors and Comorbidities Behavior Pattern;Past/Current Experience;Education;Time since onset of injury/illness/exacerbation;Social Background;Fitness;Comorbidity 3+    Comorbidities anxiety, depression, chronic pain, HLD    Examination-Activity Limitations Bend;Lift;Sit;Continence;Bed Mobility    Examination-Participation Restrictions Occupation;Cleaning;Community Activity;Volunteer;Yard Work;Shop    Stability/Clinical Decision Making Evolving/Moderate complexity    Rehab Potential Fair    PT Frequency 2x / week    PT Duration 6 weeks    PT Treatment/Interventions ADLs/Self Care Home  Management;Aquatic Therapy;Canalith Repostioning;Cryotherapy;Electrical Stimulation;Biofeedback;Iontophoresis 4mg /ml Dexamethasone;Moist Heat;Traction;Ultrasound;DME Instruction;Gait training;Stair training;Functional mobility training;Therapeutic activities;Therapeutic exercise;Balance training;Neuromuscular re-education;Patient/family education;Orthotic Fit/Training;Manual techniques;Compression bandaging;Scar mobilization;Passive range of motion;Dry needling;Energy conservation;Splinting;Taping;Vestibular;Spinal Manipulations;Joint Manipulations    PT Next Visit Plan Continue posterior chain, core and postural strengthening.  Further  assess cervical and handache symptoms when able.  Possibly manual for pain/mobility.    PT Home Exercise Plan 10/27 POE; 11/4 self mobilization with tennis ball, shoulder pull downs and shoulder extension 11/17 Row, ext, t/sp ext           Patient will benefit from skilled therapeutic intervention in order to improve the following deficits and impairments:  Decreased range of motion, Decreased endurance, Increased muscle spasms, Decreased activity tolerance, Hypomobility, Pain, Impaired flexibility, Decreased balance, Decreased mobility, Decreased strength, Impaired sensation, Improper body mechanics  Visit Diagnosis: Low back pain, unspecified back pain laterality, unspecified chronicity, unspecified whether sciatica present  Cervicalgia  Muscle weakness (generalized)  Other abnormalities of gait and mobility  Other symptoms and signs involving the musculoskeletal system     Problem List Patient Active Problem List   Diagnosis Date Noted  . Migraine without aura and without status migrainosus, not intractable 08/01/2020  . Need for immunization against influenza 08/01/2020  . Encounter for screening for malignant neoplasm of cervix 08/01/2020  . Encounter for screening mammogram for malignant neoplasm of breast 08/01/2020  . RUQ pain 05/10/2020  .  Dizziness 05/07/2020  . Facial numbness 05/07/2020  . Weakness of both lower extremities 05/07/2020  . Acute left-sided low back pain with left-sided sciatica 03/13/2020  . Overactive bladder 02/13/2020  . Acute pain of left knee 02/13/2020  . Left hip pain 02/13/2020  . Gastroesophageal reflux disease 02/13/2020  . Urgency of urination 02/13/2020  . Mixed hyperlipidemia 02/09/2020  . Vitamin D deficiency 12/07/2019  . Hypothyroidism 12/07/2019  . Iron deficiency 12/07/2019  . Depression, major, single episode, moderate (HCC) 12/07/2019  . Shortness of breath 12/07/2019  . Language barrier to communication 12/07/2019    9:22 AM, 08/19/20 Wyman Songster PT, DPT Physical Therapist at [facility]   [facility] [address] [place], [place], [address] Phone: [phone]   Fax:  [phone]  Name: Jennifer Wright MRN: [id number] Date of Birth: June 05, 1966

## 2020-08-20 ENCOUNTER — Other Ambulatory Visit: Payer: Self-pay

## 2020-08-20 DIAGNOSIS — E039 Hypothyroidism, unspecified: Secondary | ICD-10-CM

## 2020-08-20 MED ORDER — LEVOTHYROXINE SODIUM 75 MCG PO TABS: 75.0000 ug | ORAL_TABLET | Freq: Every day | ORAL | 0 refills | Status: DC

## 2020-08-21 ENCOUNTER — Other Ambulatory Visit: Payer: Self-pay

## 2020-08-21 ENCOUNTER — Ambulatory Visit (HOSPITAL_COMMUNITY): Payer: Medicaid Other | Attending: Neurological Surgery | Admitting: Physical Therapy

## 2020-08-21 ENCOUNTER — Encounter (HOSPITAL_COMMUNITY): Payer: Self-pay | Admitting: Physical Therapy

## 2020-08-21 DIAGNOSIS — M6281 Muscle weakness (generalized): Secondary | ICD-10-CM | POA: Diagnosis present

## 2020-08-21 DIAGNOSIS — M542 Cervicalgia: Secondary | ICD-10-CM | POA: Insufficient documentation

## 2020-08-21 DIAGNOSIS — R29898 Other symptoms and signs involving the musculoskeletal system: Secondary | ICD-10-CM | POA: Insufficient documentation

## 2020-08-21 DIAGNOSIS — R2689 Other abnormalities of gait and mobility: Secondary | ICD-10-CM | POA: Insufficient documentation

## 2020-08-21 DIAGNOSIS — M545 Low back pain, unspecified: Secondary | ICD-10-CM | POA: Insufficient documentation

## 2020-08-21 NOTE — Therapy (Signed)
[facility] [address] [place], [place], [address] Phone: [phone]   Fax:  [phone]  Physical Therapy Treatment  Patient Details  Name: Jennifer Wright MRN: [id number] Date of Birth: October 18, 1965 Referring Provider (PT): Kendell Bane Dawley DO   Encounter Date: 08/21/2020   PT End of Session - 08/21/20 0831    Visit Number 6    Number of Visits 12    Date for PT Re-Evaluation 08/28/20    Authorization Type [employer] community health medicaid (no aurth required for pt's over 21, 27 comb visits for year)    Authorization - Visit Number 6    Authorization - Number of Visits 27    Progress Note Due on Visit 10    PT Start Time 0831    PT Stop Time 0910    PT Time Calculation (min) 39 min    Activity Tolerance Patient tolerated treatment well    Behavior During Therapy [facility] for tasks assessed/performed           Past Medical History:  Diagnosis Date  . Anxiety   . Asbestos exposure   . Asthma   . Depression   . Encounter for screening for malignant neoplasm of colon 12/07/2019  . Family history of diabetes mellitus 12/07/2019  . Family history of hyperlipidemia 12/07/2019  . Generalized headaches 12/07/2019  . GERD (gastroesophageal reflux disease)   . Hyperlipemia   . Neck and shoulder pain 12/07/2019  . Shortness of breath 12/07/2019  . Thyroid disease   . Upper back pain 12/07/2019    Past Surgical History:  Procedure Laterality Date  . ABDOMINAL HYSTERECTOMY    . TUBAL LIGATION      There were no vitals filed for this visit.   Subjective Assessment - 08/21/20 0832    Subjective Patient states that she is still hurting but it is better than when she came in last time. She is still getting a lot of headaches. At least 3 or more times per month and lasting 3-5 days. She feels like she has symptoms that a headache is coming but it hasn't started yet.    Patient is accompained by: Interpreter    Patient Stated Goals to be able to work  with the pain    Currently in Pain? Yes    Pain Score 4     Pain Location Back    Pain Orientation Mid;Upper    Pain Descriptors / Indicators Tightness    Pain Onset More than a month ago                             [facility] Adult PT Treatment/Exercise - 08/21/20 0001      Lumbar Exercises: Standing   Other Standing Lumbar Exercises y lift off wall 2x 10    Other Standing Lumbar Exercises scapular protraction retraction with arms on wall 2 x10      Lumbar Exercises: Supine   Other Supine Lumbar Exercises scapular protraction/retraction 2x15 bilateral     Other Supine Lumbar Exercises cervical retraction in supine 2x10       Lumbar Exercises: Prone   Other Prone Lumbar Exercises prone shoulder extension and horzontial abduction 2x 10 each R shoulder 1#      Manual Therapy   Manual Therapy Soft tissue mobilization;Joint mobilization    Manual therapy comments Manual complete separate than rest of tx    Soft tissue mobilization Prone: STM to paraspinals  and UT R middle trap, rhomboids with percussion gun level 7                   PT Education - 08/21/20 0831    Education Details Patient educated on HEP, exercise mechanics    Person(s) Educated Patient    Methods Explanation;Demonstration    Comprehension Verbalized understanding;Returned demonstration            PT Short Term Goals - 07/17/20 0930      PT SHORT TERM GOAL #1   Title Patient will be independent with HEP in order to improve functional outcomes.    Time 3    Period Weeks    Status New    Target Date 08/07/20      PT SHORT TERM GOAL #2   Title Patient will report at least 25% improvement in symptoms for improved quality of life.    Time 3    Period Weeks    Status New    Target Date 08/07/20             PT Long Term Goals - 07/17/20 0931      PT LONG TERM GOAL #1   Title Patient will report at least 75% improvement in symptoms for improved quality of life.    Time 6     Period Weeks    Status New    Target Date 08/28/20      PT LONG TERM GOAL #2   Title Patient will be able to complete 5x STS in under 11.4 seconds in order to reduce the risk of falls and demonstrate improved functional strength.    Time 6    Period Weeks    Status New    Target Date 08/28/20      PT LONG TERM GOAL #3   Title Patient will report centralized lumbar symptoms with symptoms no greater than 3/10 for improved ability to work.    Time 6    Period Weeks    Status New    Target Date 08/28/20                 Plan - 08/21/20 0865    Clinical Impression Statement Patient continues to have tender and hyperactive periscapular muscles. Completed STM and IASTM with percussion gun to periscapular muscles for decreasing tissue tension and symptoms. Patient states improvement following. Patient completes scapular protraction/retraction in supine with fair motor control today. She requires verbal and tactile cueing for cervical retractions in supine with good carry over. Patient feeling reduction in pre-headache symptoms following cervical retractions. She is able to progress to resistance with prone exercises today. Patient showing improving motor control with scap protraction/retraction at wall. Patient will continue to benefit from skilled physical therapy in order to reduce impairment and improve function.    Personal Factors and Comorbidities Behavior Pattern;Past/Current Experience;Education;Time since onset of injury/illness/exacerbation;Social Background;Fitness;Comorbidity 3+    Comorbidities anxiety, depression, chronic pain, HLD    Examination-Activity Limitations Bend;Lift;Sit;Continence;Bed Mobility    Examination-Participation Restrictions Occupation;Cleaning;Community Activity;Volunteer;Yard Work;Shop    Stability/Clinical Decision Making Evolving/Moderate complexity    Rehab Potential Fair    PT Frequency 2x / week    PT Duration 6 weeks    PT Treatment/Interventions  ADLs/Self Care Home Management;Aquatic Therapy;Canalith Repostioning;Cryotherapy;Electrical Stimulation;Biofeedback;Iontophoresis 4mg /ml Dexamethasone;Moist Heat;Traction;Ultrasound;DME Instruction;Gait training;Stair training;Functional mobility training;Therapeutic activities;Therapeutic exercise;Balance training;Neuromuscular re-education;Patient/family education;Orthotic Fit/Training;Manual techniques;Compression bandaging;Scar mobilization;Passive range of motion;Dry needling;Energy conservation;Splinting;Taping;Vestibular;Spinal Manipulations;Joint Manipulations    PT Next Visit Plan Continue posterior chain, core and postural  strengthening.  Further assess cervical and handache symptoms when able.  Possibly manual for pain/mobility.    PT Home Exercise Plan 10/27 POE; 11/4 self mobilization with tennis ball, shoulder pull downs and shoulder extension 11/17 Row, ext, t/sp ext 12/1 cervical retraction, prone horizontal abduction, prone shoulder extension           Patient will benefit from skilled therapeutic intervention in order to improve the following deficits and impairments:  Decreased range of motion, Decreased endurance, Increased muscle spasms, Decreased activity tolerance, Hypomobility, Pain, Impaired flexibility, Decreased balance, Decreased mobility, Decreased strength, Impaired sensation, Improper body mechanics  Visit Diagnosis: Low back pain, unspecified back pain laterality, unspecified chronicity, unspecified whether sciatica present  Cervicalgia  Muscle weakness (generalized)  Other abnormalities of gait and mobility  Other symptoms and signs involving the musculoskeletal system     Problem List Patient Active Problem List   Diagnosis Date Noted  . Migraine without aura and without status migrainosus, not intractable 08/01/2020  . Need for immunization against influenza 08/01/2020  . Encounter for screening for malignant neoplasm of cervix 08/01/2020  . Encounter  for screening mammogram for malignant neoplasm of breast 08/01/2020  . RUQ pain 05/10/2020  . Dizziness 05/07/2020  . Facial numbness 05/07/2020  . Weakness of both lower extremities 05/07/2020  . Acute left-sided low back pain with left-sided sciatica 03/13/2020  . Overactive bladder 02/13/2020  . Acute pain of left knee 02/13/2020  . Left hip pain 02/13/2020  . Gastroesophageal reflux disease 02/13/2020  . Urgency of urination 02/13/2020  . Mixed hyperlipidemia 02/09/2020  . Vitamin D deficiency 12/07/2019  . Hypothyroidism 12/07/2019  . Iron deficiency 12/07/2019  . Depression, major, single episode, moderate (HCC) 12/07/2019  . Shortness of breath 12/07/2019  . Language barrier to communication 12/07/2019    9:10 AM, 08/21/20 Wyman Songster PT, DPT Physical Therapist at [facility]   [facility] [address] [place], [place], [address] Phone: [phone]   Fax:  [phone]  Name: JOHNIYA DURFEE MRN: [id number] Date of Birth: 08/28/1966

## 2020-08-21 NOTE — Patient Instructions (Signed)
Access Code: [facility] URL: [email or web address] Date: 08/21/2020 Prepared by: Greig Castilla Cillian Gwinner  Exercises Supine Chin Tuck - 1 x daily - 7 x weekly - 2 sets - 10 reps Prone Shoulder Extension - Single Arm - 1 x daily - 7 x weekly - 2 sets - 10 reps Prone Single Arm Shoulder Horizontal Abduction with Dumbbell - 1 x daily - 7 x weekly - 2 sets - 10 reps

## 2020-08-26 ENCOUNTER — Telehealth (HOSPITAL_COMMUNITY): Payer: Self-pay | Admitting: Physical Therapy

## 2020-08-26 ENCOUNTER — Ambulatory Visit (HOSPITAL_COMMUNITY): Payer: Medicaid Other | Admitting: Physical Therapy

## 2020-08-26 NOTE — Telephone Encounter (Signed)
Patient no show, unable to leave message for patient to make her aware of missed appointment as she does not have a voicemail set up.  8:54 AM, 08/26/20 Wyman Songster PT, DPT Physical Therapist at [facility]

## 2020-08-28 ENCOUNTER — Telehealth (HOSPITAL_COMMUNITY): Payer: Self-pay | Admitting: Physical Therapy

## 2020-08-28 ENCOUNTER — Ambulatory Visit (HOSPITAL_COMMUNITY): Payer: Medicaid Other | Admitting: Physical Therapy

## 2020-08-28 NOTE — Telephone Encounter (Signed)
pt left message on vm to cancel today's appt.

## 2020-08-29 ENCOUNTER — Encounter: Payer: Self-pay | Admitting: Adult Health
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
## 2020-09-19 ENCOUNTER — Encounter: Payer: Self-pay | Admitting: Neurology

## 2020-09-19 ENCOUNTER — Ambulatory Visit (INDEPENDENT_AMBULATORY_CARE_PROVIDER_SITE_OTHER): Payer: Medicaid Other | Admitting: Neurology

## 2020-09-19 ENCOUNTER — Other Ambulatory Visit: Payer: Self-pay

## 2020-09-19 VITALS — BP 123/78 | HR 69 | Ht 62.0 in | Wt 120.6 lb

## 2020-09-19 DIAGNOSIS — G43019 Migraine without aura, intractable, without status migrainosus: Secondary | ICD-10-CM | POA: Diagnosis not present

## 2020-09-19 MED ORDER — SUMATRIPTAN SUCCINATE 100 MG PO TABS: ORAL_TABLET | ORAL | 5 refills | Status: DC

## 2020-09-19 MED ORDER — TOPIRAMATE 25 MG PO TABS: ORAL_TABLET | ORAL | 0 refills | Status: DC

## 2020-09-19 NOTE — Patient Instructions (Signed)
1. Inicie la tableta de topiramato de 25 mg. Tome 1 tableta a la hora de acostarse durante [place], luego aumente a 2 tabletas a la hora de [place]. Contctenos en 5 semanas con actualizacin y podemos aumentar la dosis si es necesario. 2. Tome sumatriptn 100 mg al inicio ms temprano del dolor de [place]. Puede repetir la dosis una vez cada 2 horas si es necesario. Mximo 2 comprimidos en 24 horas. 3. Limite el uso de analgsicos a no ms de 2 das a Lawyer. Estos medicamentos incluyen acetaminofn, AINE (ibuprofeno / Advil / Motrin, naproxeno / Aleve, triptanos (Imitrex / sumatriptan), Excedrin y narcticos. Esto ayudar a Software engineer de dolores de cabeza de rebote. 4. Tenga en cuenta los factores desencadenantes comunes de los alimentos: 5. - Cafena: caf, t negro, cola, Mt. Dew 6. - Chocolate 7. - Lcteos: quesos curados (brie, [place], [place], [place], [place], [place], [place], suizo, Catering manager.), [place] con chocolate, suero de [place], crema [place], [place] limitados y Dentist 8. - [place], [place] de man 9. - Alcohol 10. - Cereales / granos: panes FRESCOS (bagels frescos, masa madre, rosquillas), producciones de levadura 11. - Carnes procesadas / enlatadas / curadas / curadas (embutidos preenvasados, perritos calientes) 12. - MSG / glutamato: salsa de soja, potenciador del sabor, alimentos encurtidos / conservados / marinados 13. - Edulcorantes: aspartamo (Equal, Nutrasweet). Sugar y Splenda estn bien 14. - Verduras: legumbres (habas, lentejas, guisantes, habas, guisantes pintos, garbanzos), chucrut, cebollas, aceitunas, encurtidos 15. - Fruta: aguacates, pltanos, ctricos (naranja, limn, pomelo), mango 16. - Otros: Platos congelados, macarrones con queso. 17. Ejercicio de rutina 18. Mantngase adecuadamente hidratado (intente consumir 64 oz de agua al da) 19. Lleve un diario de los dolores de [place] 20. Mantener un manejo adecuado del estrs 21. Mantener una higiene adecuada del  sueo. 22. No saltes comidas

## 2020-09-26 ENCOUNTER — Encounter: Payer: Medicaid Other | Admitting: Adult Health

## 2020-10-01 ENCOUNTER — Ambulatory Visit: Payer: Medicaid Other | Admitting: Urology

## 2020-10-01 DIAGNOSIS — N3281 Overactive bladder: Secondary | ICD-10-CM

## 2020-10-01 NOTE — Progress Notes (Incomplete)
H&P  Chief Complaint: ***  History of Present Illness: Jennifer Wright is a 55 y.o. year old female here for f/u of her UUI/SUI  1.11.2022:   (below copied from [facility] records):  10.5.2021: Pt presents with UUI/SUI (onset 1.5 years ago and recently worsening). Pt denies any recent UTIs or dysuria. Pt reports a good FOS, but occasionally feels that she is unable to completely empty her bladders. She uses approximately 10 pads per day and changes them as soon as they are dampened. Pt is incontinent with urgency, coughing, sneezing, but reports urgency causes the largest leakage volume. Pt denies drinking much coffee or soda.  Pt was prescribed Hyoscyamine but notes that it has not helped her symptoms and causes constipation. She only takes it twice a day.  Past Medical History:  Diagnosis Date  . Anxiety   . Asbestos exposure   . Asthma   . Depression   . Encounter for screening for malignant neoplasm of colon 12/07/2019  . Family history of diabetes mellitus 12/07/2019  . Family history of hyperlipidemia 12/07/2019  . Generalized headaches 12/07/2019  . GERD (gastroesophageal reflux disease)   . Hyperlipemia   . Neck and shoulder pain 12/07/2019  . Shortness of breath 12/07/2019  . Thyroid disease   . Upper back pain 12/07/2019    Past Surgical History:  Procedure Laterality Date  . ABDOMINAL HYSTERECTOMY    . TUBAL LIGATION      Home Medications:  (Not in a hospital admission)   Allergies: No Known Allergies  Family History  Problem Relation Age of Onset  . Cancer Mother        stomach cancer  . Cancer Brother        stomach cancer  . Colon cancer Neg Hx   . Colon polyps Neg Hx     Social History:  reports that she has never smoked. She has never used smokeless tobacco. She reports current alcohol use. She reports that she does not use drugs.  ROS: A complete review of systems was performed.  All systems are negative except for pertinent findings as noted.  Physical  Exam:  Vital signs in last 24 hours:   General:  Alert and oriented, No acute distress HEENT: Normocephalic, atraumatic Neck: No JVD or lymphadenopathy Cardiovascular: Regular rate  Lungs: Normal inspiratory/expiratory excursion Abdomen: Soft, nontender, nondistended, no abdominal masses Back: No CVA tenderness Extremities: No edema Neurologic: Grossly intact  Laboratory Data:  No results found for this or any previous visit (from the past 24 hour(s)). No results found for this or any previous visit (from the past 240 hour(s)). Creatinine: No results for input(s): CREATININE in the last 168 hours.  Radiologic Imaging: No results found.  Impression/Assessment:  ***  Plan:  ***  Shaun Pitts 10/01/2020, 11:27 AM  Bertram Millard. Dahlstedt MD

## 2020-11-04 ENCOUNTER — Encounter: Payer: Self-pay | Admitting: Orthopaedic Surgery

## 2020-11-04 ENCOUNTER — Ambulatory Visit: Payer: Medicaid Other | Admitting: Nurse Practitioner

## 2020-11-04 ENCOUNTER — Encounter: Payer: Self-pay | Admitting: Nurse Practitioner

## 2020-11-04 ENCOUNTER — Other Ambulatory Visit: Payer: Self-pay

## 2020-11-04 VITALS — BP 132/84 | HR 68 | Temp 98.0°F | Resp 18 | Ht 61.0 in | Wt 122.0 lb

## 2020-11-04 DIAGNOSIS — G43009 Migraine without aura, not intractable, without status migrainosus: Secondary | ICD-10-CM | POA: Diagnosis not present

## 2020-11-04 DIAGNOSIS — E039 Hypothyroidism, unspecified: Secondary | ICD-10-CM | POA: Diagnosis not present

## 2020-11-04 DIAGNOSIS — R682 Dry mouth, unspecified: Secondary | ICD-10-CM

## 2020-11-04 DIAGNOSIS — E782 Mixed hyperlipidemia: Secondary | ICD-10-CM | POA: Diagnosis not present

## 2020-11-04 DIAGNOSIS — M25531 Pain in right wrist: Secondary | ICD-10-CM | POA: Diagnosis not present

## 2020-11-04 MED ORDER — ROSUVASTATIN CALCIUM 10 MG PO TABS: 10.0000 mg | ORAL_TABLET | Freq: Every day | ORAL | 0 refills | Status: DC

## 2020-11-04 MED ORDER — ALBUTEROL SULFATE HFA 108 (90 BASE) MCG/ACT IN AERS: 1.0000 | INHALATION_SPRAY | Freq: Four times a day (QID) | RESPIRATORY_TRACT | 0 refills | Status: DC | PRN

## 2020-11-04 MED ORDER — RIZATRIPTAN BENZOATE 10 MG PO TBDP: 10.0000 mg | ORAL_TABLET | ORAL | 0 refills | Status: DC | PRN

## 2020-11-04 MED ORDER — BUDESONIDE-FORMOTEROL FUMARATE 160-4.5 MCG/ACT IN AERO
2.0000 | INHALATION_SPRAY | Freq: Every day | RESPIRATORY_TRACT | 0 refills | Status: DC
Start: 2020-11-04 — End: 2021-03-05

## 2020-11-04 MED ORDER — LEVOTHYROXINE SODIUM 75 MCG PO TABS: 75.0000 ug | ORAL_TABLET | Freq: Every day | ORAL | 0 refills | Status: DC

## 2020-11-04 NOTE — Progress Notes (Signed)
Acute Office Visit  Subjective:    Patient ID: Jennifer Wright, female    DOB: 18-Dec-1965, 55 y.o.   MRN: [id number]  Chief Complaint  Patient presents with  . Dehydration    Dry mouth x 2 weeks; craving water.   Marland Kitchen Headache    R sided; with slight drooping of the R eye.     HPI Patient is in today for dry mouth.  She states that she is drinking a bunch of water  Her headache started 5 days ago. She is having right-facial numbness.  She states her pain is 9-10/10. She states that her pain has been this bad before, and she was referred to neurology.  She took a migraine medication, but she does not remember the name, but it had too many side effects.  Past Medical History:  Diagnosis Date  . Anxiety   . Asbestos exposure   . Asthma   . Depression   . Encounter for screening for malignant neoplasm of colon 12/07/2019  . Family history of diabetes mellitus 12/07/2019  . Family history of hyperlipidemia 12/07/2019  . Generalized headaches 12/07/2019  . GERD (gastroesophageal reflux disease)   . Hyperlipemia   . Neck and shoulder pain 12/07/2019  . Shortness of breath 12/07/2019  . Thyroid disease   . Upper back pain 12/07/2019    Past Surgical History:  Procedure Laterality Date  . ABDOMINAL HYSTERECTOMY    . TUBAL LIGATION      Family History  Problem Relation Age of Onset  . Cancer Mother        stomach cancer  . Cancer Brother        stomach cancer  . Colon cancer Neg Hx   . Colon polyps Neg Hx     Social History   Socioeconomic History  . Marital status: Divorced    Spouse name: Not on file  . Number of children: 6  . Years of education: Not on file  . Highest education level: 1st grade  Occupational History  . Not on file  Tobacco Use  . Smoking status: Never Smoker  . Smokeless tobacco: Never Used  Vaping Use  . Vaping Use: Never used  Substance and Sexual Activity  . Alcohol use: Yes    Comment: socially  . Drug use: Never  . Sexual activity: Yes     Birth control/protection: Condom, Surgical  Other Topics Concern  . Not on file  Social History Narrative   Moved from [place] at 58 years old   51 children   Lives with daughter -Stanton Kidney and Salley Scarlet' and her two children       Enjoys working around the house      Diet: eats all food groups   Caffeine: coffee 2-3 cups   Water: 4-6 cups daily      Wears seat belt    Does not drive, but can.   Smoke detectors at home           Social Determinants of Health   Financial Resource Strain: Low Risk   . Difficulty of Paying Living Expenses: Not hard at all  Food Insecurity: No Food Insecurity  . Worried About Charity fundraiser in the Last Year: Never true  . Ran Out of Food in the Last Year: Never true  Transportation Needs: No Transportation Needs  . Lack of Transportation (Medical): No  . Lack of Transportation (Non-Medical): No  Physical Activity: Inactive  . Days of Exercise per Week:  0 days  . Minutes of Exercise per Session: 0 min  Stress: No Stress Concern Present  . Feeling of Stress : Only a little  Social Connections: Socially Isolated  . Frequency of Communication with Friends and Family: More than three times a week  . Frequency of Social Gatherings with Friends and Family: More than three times a week  . Attends Religious Services: Never  . Active Member of Clubs or Organizations: No  . Attends Archivist Meetings: Never  . Marital Status: Separated  Intimate Partner Violence: Not At Risk  . Fear of Current or Ex-Partner: No  . Emotionally Abused: No  . Physically Abused: No  . Sexually Abused: No    Outpatient Medications Prior to Visit  Medication Sig Dispense Refill  . busPIRone (BUSPAR) 5 MG tablet Take 1 tablet (5 mg total) by mouth 2 (two) times daily. 60 tablet 3  . fesoterodine (TOVIAZ) 8 MG TB24 tablet Take 1 tablet (8 mg total) by mouth daily. 30 tablet 11  . hyoscyamine (LEVSIN) 0.125 MG tablet Take 1 tablet (0.125 mg total) by mouth every  6 (six) hours as needed for bladder spasms. 30 tablet 0  . mirtazapine (REMERON) 15 MG tablet Take 1 tablet (15 mg total) by mouth at bedtime. 30 tablet 0  . naproxen (NAPROSYN) 500 MG tablet Take 1 tablet (500 mg total) by mouth 2 (two) times daily with a meal. 60 tablet 5  . omeprazole (PRILOSEC) 20 MG capsule Take 1 capsule (20 mg total) by mouth 2 (two) times daily before a meal. 60 capsule 2  . ondansetron (ZOFRAN) 4 MG tablet Take 1 tablet (4 mg total) by mouth every 6 (six) hours. 12 tablet 0  . pantoprazole (PROTONIX) 40 MG tablet Take 1 tablet (40 mg total) by mouth 2 (two) times daily before a meal. 90 tablet 3  . QUEtiapine (SEROQUEL) 100 MG tablet Take 1 tablet (100 mg total) by mouth at bedtime. 30 tablet 0  . sertraline (ZOLOFT) 100 MG tablet Take 1 tablet (100 mg total) by mouth daily. 30 tablet 3  . tiotropium (SPIRIVA) 18 MCG inhalation capsule Place 1 capsule (18 mcg total) into inhaler and inhale daily. 30 capsule 0  . topiramate (TOPAMAX) 25 MG tablet Take 1 tablet at bedtime for one week, then increase to 2 tablets at bedtime. 60 tablet 0  . Vitamin D, Ergocalciferol, (DRISDOL) 1.25 MG (50000 UNIT) CAPS capsule Take 1 capsule (50,000 Units total) by mouth every 7 (seven) days. 12 capsule 1  . albuterol (VENTOLIN HFA) 108 (90 Base) MCG/ACT inhaler Inhale 1 puff into the lungs every 6 (six) hours as needed for wheezing or shortness of breath. Inhale 1 puff into the lungs every 6 (six) hours as needed for wheezing or shortness of breath. 18 g 0  . budesonide-formoterol (SYMBICORT) 160-4.5 MCG/ACT inhaler Inhale 2 puffs into the lungs daily. 1 Inhaler 0  . levothyroxine (SYNTHROID) 75 MCG tablet Take 1 tablet (75 mcg total) by mouth daily before breakfast. 90 tablet 0  . rosuvastatin (CRESTOR) 10 MG tablet Take 1 tablet (10 mg total) by mouth daily. 90 tablet 0  . SUMAtriptan (IMITREX) 100 MG tablet Take 1 tablet earliest onset of headache.  May repeat in 2 hours if headache persists  or recurs.  Maximum 2 tablets in 24 hours. 10 tablet 5   No facility-administered medications prior to visit.    No Known Allergies  Review of Systems  Constitutional: Negative.   Respiratory: Negative.  Cardiovascular: Negative.   Neurological: Positive for numbness and headaches. Negative for dizziness, facial asymmetry and speech difficulty.       Objective:    Physical Exam Constitutional:      Appearance: She is well-developed.  Eyes:     General: No visual field deficit. Cardiovascular:     Rate and Rhythm: Normal rate and regular rhythm.     Heart sounds: Normal heart sounds.  Pulmonary:     Effort: Pulmonary effort is normal.     Breath sounds: Normal breath sounds.  Neurological:     Mental Status: She is alert and oriented to person, place, and time. Mental status is at baseline.     GCS: GCS eye subscore is 4. GCS verbal subscore is 5. GCS motor subscore is 6.     Cranial Nerves: No cranial nerve deficit, dysarthria or facial asymmetry.     Motor: No weakness.     Coordination: Coordination normal.     Comments: No pronator drift     BP 132/84   Pulse 68   Temp 98 F (36.7 C)   Resp 18   Ht 5' 1" (1.549 m)   Wt 122 lb (55.3 kg)   SpO2 97%   BMI 23.05 kg/m  Wt Readings from Last 3 Encounters:  11/04/20 122 lb (55.3 kg)  09/19/20 120 lb 9.6 oz (54.7 kg)  08/17/20 125 lb (56.7 kg)    Health Maintenance Due  Topic Date Due  . COVID-19 Vaccine (1) Never done  . HIV Screening  Never done  . TETANUS/TDAP  Never done  . PAP SMEAR-Modifier  Never done  . COLONOSCOPY (Pts 45-30yr Insurance coverage will need to be confirmed)  Never done  . MAMMOGRAM  Never done    There are no preventive care reminders to display for this patient.   Lab Results  Component Value Date   TSH 3.02 05/09/2020   Lab Results  Component Value Date   WBC 4.7 05/09/2020   HGB 12.6 05/09/2020   HCT 38.0 05/09/2020   MCV 81.7 05/09/2020   PLT 274 05/09/2020   Lab  Results  Component Value Date   NA 139 05/09/2020   K 3.7 05/09/2020   CO2 27 05/09/2020   GLUCOSE 102 05/09/2020   BUN 7 05/09/2020   CREATININE 0.46 (L) 05/09/2020   BILITOT 0.3 06/26/2020   ALKPHOS 112 06/26/2020   AST 47 (H) 06/26/2020   ALT 32 06/26/2020   PROT 7.1 06/26/2020   ALBUMIN 4.5 06/26/2020   CALCIUM 9.1 05/09/2020   ANIONGAP 12 06/21/2019   Lab Results  Component Value Date   CHOL 250 (H) 02/09/2020   Lab Results  Component Value Date   HDL 62 02/09/2020   Lab Results  Component Value Date   LDLCALC 160 (H) 02/09/2020   Lab Results  Component Value Date   TRIG 146 02/09/2020   Lab Results  Component Value Date   CHOLHDL 4.0 02/09/2020   Lab Results  Component Value Date   HGBA1C 5.2 12/07/2019       Assessment & Plan:   Problem List Items Addressed This Visit      Cardiovascular and Mediastinum   Migraine without aura and without status migrainosus, not intractable    -Rx. maxalt -she reports facial numbness, but states that imitrex helped some with this; she has hx of migraine with facial numbness -we discussed migraine vs stroke; if she feels worse, go to the ED immediately for imaging -no  facial droop, cranial nerves are normal, visual fields are intact, no pronator drift, grips are equal and gait is ok      Relevant Medications   rizatriptan (MAXALT-MLT) 10 MG disintegrating tablet   rosuvastatin (CRESTOR) 10 MG tablet   Other Relevant Orders   CBC with Differential/Platelet   CMP14+EGFR     Digestive   Dry mouth     Endocrine   Hypothyroidism   Relevant Medications   levothyroxine (SYNTHROID) 75 MCG tablet   Other Relevant Orders   TSH + free T4     Other   Mixed hyperlipidemia   Relevant Medications   rosuvastatin (CRESTOR) 10 MG tablet   Other Relevant Orders   Lipid Panel With LDL/HDL Ratio       Meds ordered this encounter  Medications  . rizatriptan (MAXALT-MLT) 10 MG disintegrating tablet    Sig: Take 1  tablet (10 mg total) by mouth as needed for migraine. May repeat in 2 hours if needed. Do not use with sumatriptan.    Dispense:  10 tablet    Refill:  0  . levothyroxine (SYNTHROID) 75 MCG tablet    Sig: Take 1 tablet (75 mcg total) by mouth daily before breakfast.    Dispense:  30 tablet    Refill:  0  . albuterol (VENTOLIN HFA) 108 (90 Base) MCG/ACT inhaler    Sig: Inhale 1 puff into the lungs every 6 (six) hours as needed for wheezing or shortness of breath. Inhale 1 puff into the lungs every 6 (six) hours as needed for wheezing or shortness of breath.    Dispense:  18 g    Refill:  0  . budesonide-formoterol (SYMBICORT) 160-4.5 MCG/ACT inhaler    Sig: Inhale 2 puffs into the lungs daily.    Dispense:  1 each    Refill:  0  . rosuvastatin (CRESTOR) 10 MG tablet    Sig: Take 1 tablet (10 mg total) by mouth daily.    Dispense:  90 tablet    Refill:  0     Noreene Larsson, NP

## 2020-11-04 NOTE — Addendum Note (Signed)
Addended by: Dellia Cloud on: 11/04/2020 02:35 PM   Modules accepted: Orders

## 2020-11-04 NOTE — Patient Instructions (Signed)
If symptoms get worse or persist after using maxalt, go to the emergency department for imaging.

## 2020-11-04 NOTE — Assessment & Plan Note (Addendum)
-  Rx. maxalt -she reports facial numbness, but states that imitrex helped some with this; she has hx of migraine with facial numbness -we discussed migraine vs stroke; if she feels worse, go to the ED immediately for imaging -no facial droop, cranial nerves are normal, visual fields are intact, no pronator drift, grips are equal and gait is ok

## 2020-11-04 NOTE — Assessment & Plan Note (Signed)
-  ongoing right wrist pain -she is wearing splint today, but that isn't helping -refer to ortho

## 2020-11-06 LAB — CBC WITH DIFFERENTIAL/PLATELET
Basophils Absolute: 0 10*3/uL (ref 0.0–0.2)
Basos: 1 %
EOS (ABSOLUTE): 0.1 10*3/uL (ref 0.0–0.4)
Eos: 1 %
Hematocrit: 37.7 % (ref 34.0–46.6)
Hemoglobin: 12.8 g/dL (ref 11.1–15.9)
Immature Grans (Abs): 0 10*3/uL (ref 0.0–0.1)
Immature Granulocytes: 0 %
Lymphocytes Absolute: 1.9 10*3/uL (ref 0.7–3.1)
Lymphs: 36 %
MCH: 28 pg (ref 26.6–33.0)
MCHC: 34 g/dL (ref 31.5–35.7)
MCV: 83 fL (ref 79–97)
Monocytes Absolute: 0.3 10*3/uL (ref 0.1–0.9)
Monocytes: 7 %
Neutrophils Absolute: 2.9 10*3/uL (ref 1.4–7.0)
Neutrophils: 55 %
Platelets: 304 10*3/uL (ref 150–450)
RBC: 4.57 x10E6/uL (ref 3.77–5.28)
RDW: 13.1 % (ref 11.7–15.4)
WBC: 5.2 10*3/uL (ref 3.4–10.8)

## 2020-11-06 LAB — TSH+FREE T4
Free T4: 1.12 ng/dL (ref 0.82–1.77)
TSH: 2.63 u[IU]/mL (ref 0.450–4.500)

## 2020-11-06 LAB — LIPID PANEL WITH LDL/HDL RATIO
Cholesterol, Total: 172 mg/dL (ref 100–199)
HDL: 56 mg/dL (ref >39)
LDL Chol Calc (NIH): 98 mg/dL (ref 0–99)
LDL/HDL Ratio: 1.8 ratio (ref 0.0–3.2)
Triglycerides: 96 mg/dL (ref 0–149)
VLDL Cholesterol Cal: 18 mg/dL (ref 5–40)

## 2020-11-06 LAB — CMP14+EGFR
ALT: 13 IU/L (ref 0–32)
AST: 32 IU/L (ref 0–40)
Albumin/Globulin Ratio: 1.8 (ref 1.2–2.2)
Albumin: 4.5 g/dL (ref 3.8–4.9)
Alkaline Phosphatase: 106 IU/L (ref 44–121)
BUN/Creatinine Ratio: 27 — ABNORMAL HIGH (ref 9–23)
BUN: 14 mg/dL (ref 6–24)
Bilirubin Total: 0.4 mg/dL (ref 0.0–1.2)
CO2: 20 mmol/L (ref 20–29)
Calcium: 9.3 mg/dL (ref 8.7–10.2)
Chloride: 100 mmol/L (ref 96–106)
Creatinine, Ser: 0.52 mg/dL — ABNORMAL LOW (ref 0.57–1.00)
GFR calc Af Amer: 127 mL/min/{1.73_m2} (ref >59)
GFR calc non Af Amer: 110 mL/min/{1.73_m2} (ref >59)
Globulin, Total: 2.5 g/dL (ref 1.5–4.5)
Glucose: 90 mg/dL (ref 65–99)
Potassium: 3.9 mmol/L (ref 3.5–5.2)
Sodium: 139 mmol/L (ref 134–144)
Total Protein: 7 g/dL (ref 6.0–8.5)

## 2020-11-06 NOTE — Progress Notes (Signed)
Her labs look great. No reason to add or change meds.

## 2020-11-18 ENCOUNTER — Encounter: Payer: Self-pay | Admitting: Nurse Practitioner

## 2020-11-18 ENCOUNTER — Ambulatory Visit (INDEPENDENT_AMBULATORY_CARE_PROVIDER_SITE_OTHER): Payer: Medicaid Other | Admitting: Nurse Practitioner

## 2020-11-18 ENCOUNTER — Other Ambulatory Visit: Payer: Self-pay

## 2020-11-18 DIAGNOSIS — M25531 Pain in right wrist: Secondary | ICD-10-CM

## 2020-11-18 DIAGNOSIS — Z889 Allergy status to unspecified drugs, medicaments and biological substances status: Secondary | ICD-10-CM | POA: Insufficient documentation

## 2020-11-18 MED ORDER — NAPROXEN 500 MG PO TABS: 500.0000 mg | ORAL_TABLET | Freq: Two times a day (BID) | ORAL | 1 refills | Status: DC

## 2020-11-18 MED ORDER — EPINEPHRINE 0.3 MG/0.3ML IJ SOAJ: 0.3000 mg | INTRAMUSCULAR | 1 refills | Status: DC | PRN

## 2020-11-18 MED ORDER — PREDNISONE 10 MG PO TABS: ORAL_TABLET | ORAL | 0 refills | Status: AC

## 2020-11-18 NOTE — Progress Notes (Signed)
Acute Office Visit  Subjective:    Patient ID: Jennifer Wright, female    DOB: 23-Apr-1966, 55 y.o.   MRN: [id number]  Chief Complaint  Patient presents with  . Facial Swelling    Lower lip swelling x 2 days after using new lipstick.    HPI Patient is in today for lip swelling after using a new chapstick. She states that she applied Burt's Bees Coconut and Pear lip balm, and he lip immediately started swelling. She took benadryl and started using a lysine lip balm that she got from the pharmacist.  Past Medical History:  Diagnosis Date  . Anxiety   . Asbestos exposure   . Asthma   . Depression   . Encounter for screening for malignant neoplasm of colon 12/07/2019  . Family history of diabetes mellitus 12/07/2019  . Family history of hyperlipidemia 12/07/2019  . Generalized headaches 12/07/2019  . GERD (gastroesophageal reflux disease)   . Hyperlipemia   . Neck and shoulder pain 12/07/2019  . Shortness of breath 12/07/2019  . Thyroid disease   . Upper back pain 12/07/2019    Past Surgical History:  Procedure Laterality Date  . ABDOMINAL HYSTERECTOMY    . TUBAL LIGATION      Family History  Problem Relation Age of Onset  . Cancer Mother        stomach cancer  . Cancer Brother        stomach cancer  . Colon cancer Neg Hx   . Colon polyps Neg Hx     Social History   Socioeconomic History  . Marital status: Divorced    Spouse name: Not on file  . Number of children: 6  . Years of education: Not on file  . Highest education level: 1st grade  Occupational History  . Not on file  Tobacco Use  . Smoking status: Never Smoker  . Smokeless tobacco: Never Used  Vaping Use  . Vaping Use: Never used  Substance and Sexual Activity  . Alcohol use: Yes    Comment: socially  . Drug use: Never  . Sexual activity: Yes    Birth control/protection: Condom, Surgical  Other Topics Concern  . Not on file  Social History Narrative   Moved from [place] at 55 years old   6  children   Lives with daughter -Corrie Dandy and Rolinda Roan' and her two children       Enjoys working around the house      Diet: eats all food groups   Caffeine: coffee 2-3 cups   Water: 4-6 cups daily      Wears seat belt    Does not drive, but can.   Smoke detectors at home           Social Determinants of Health   Financial Resource Strain: Low Risk   . Difficulty of Paying Living Expenses: Not hard at all  Food Insecurity: No Food Insecurity  . Worried About Programme researcher, broadcasting/film/video in the Last Year: Never true  . Ran Out of Food in the Last Year: Never true  Transportation Needs: No Transportation Needs  . Lack of Transportation (Medical): No  . Lack of Transportation (Non-Medical): No  Physical Activity: Inactive  . Days of Exercise per Week: 0 days  . Minutes of Exercise per Session: 0 min  Stress: No Stress Concern Present  . Feeling of Stress : Only a little  Social Connections: Socially Isolated  . Frequency of Communication with Friends and Family:  More than three times a week  . Frequency of Social Gatherings with Friends and Family: More than three times a week  . Attends Religious Services: Never  . Active Member of Clubs or Organizations: No  . Attends Banker Meetings: Never  . Marital Status: Separated  Intimate Partner Violence: Not At Risk  . Fear of Current or Ex-Partner: No  . Emotionally Abused: No  . Physically Abused: No  . Sexually Abused: No    Outpatient Medications Prior to Visit  Medication Sig Dispense Refill  . albuterol (VENTOLIN HFA) 108 (90 Base) MCG/ACT inhaler Inhale 1 puff into the lungs every 6 (six) hours as needed for wheezing or shortness of breath. Inhale 1 puff into the lungs every 6 (six) hours as needed for wheezing or shortness of breath. 18 g 0  . budesonide-formoterol (SYMBICORT) 160-4.5 MCG/ACT inhaler Inhale 2 puffs into the lungs daily. 1 each 0  . busPIRone (BUSPAR) 5 MG tablet Take 1 tablet (5 mg total) by mouth 2  (two) times daily. 60 tablet 3  . fesoterodine (TOVIAZ) 8 MG TB24 tablet Take 1 tablet (8 mg total) by mouth daily. 30 tablet 11  . hyoscyamine (LEVSIN) 0.125 MG tablet Take 1 tablet (0.125 mg total) by mouth every 6 (six) hours as needed for bladder spasms. 30 tablet 0  . levothyroxine (SYNTHROID) 75 MCG tablet Take 1 tablet (75 mcg total) by mouth daily before breakfast. 30 tablet 0  . mirtazapine (REMERON) 15 MG tablet Take 1 tablet (15 mg total) by mouth at bedtime. 30 tablet 0  . naproxen (NAPROSYN) 500 MG tablet Take 1 tablet (500 mg total) by mouth 2 (two) times daily with a meal. 60 tablet 5  . omeprazole (PRILOSEC) 20 MG capsule Take 1 capsule (20 mg total) by mouth 2 (two) times daily before a meal. 60 capsule 2  . ondansetron (ZOFRAN) 4 MG tablet Take 1 tablet (4 mg total) by mouth every 6 (six) hours. 12 tablet 0  . pantoprazole (PROTONIX) 40 MG tablet Take 1 tablet (40 mg total) by mouth 2 (two) times daily before a meal. 90 tablet 3  . QUEtiapine (SEROQUEL) 100 MG tablet Take 1 tablet (100 mg total) by mouth at bedtime. 30 tablet 0  . rizatriptan (MAXALT-MLT) 10 MG disintegrating tablet Take 1 tablet (10 mg total) by mouth as needed for migraine. May repeat in 2 hours if needed. Do not use with sumatriptan. 10 tablet 0  . rosuvastatin (CRESTOR) 10 MG tablet Take 1 tablet (10 mg total) by mouth daily. 90 tablet 0  . sertraline (ZOLOFT) 100 MG tablet Take 1 tablet (100 mg total) by mouth daily. 30 tablet 3  . tiotropium (SPIRIVA) 18 MCG inhalation capsule Place 1 capsule (18 mcg total) into inhaler and inhale daily. 30 capsule 0  . topiramate (TOPAMAX) 25 MG tablet Take 1 tablet at bedtime for one week, then increase to 2 tablets at bedtime. 60 tablet 0  . Vitamin D, Ergocalciferol, (DRISDOL) 1.25 MG (50000 UNIT) CAPS capsule Take 1 capsule (50,000 Units total) by mouth every 7 (seven) days. 12 capsule 1   No facility-administered medications prior to visit.    No Known  Allergies  Review of Systems  HENT:       Lip swelling that started yesterday, stopped swelling with benadryl  Musculoskeletal: Positive for arthralgias.  Allergic/Immunologic: Positive for environmental allergies and food allergies.       Objective:    Physical Exam Constitutional:  General: She is not in acute distress.    Appearance: Normal appearance.  HENT:     Mouth/Throat:     Comments: -bottom lip is swollen with broken skin in 2 locations  Musculoskeletal:     Comments: Wearing right wrist splint  Neurological:     Mental Status: She is alert.     BP 128/85   Pulse 86   Temp 98.7 F (37.1 C)   Resp 20   Ht 5\' 1"  (1.549 m)   Wt 124 lb (56.2 kg)   SpO2 97%   BMI 23.43 kg/m  Wt Readings from Last 3 Encounters:  11/18/20 124 lb (56.2 kg)  11/04/20 122 lb (55.3 kg)  09/19/20 120 lb 9.6 oz (54.7 kg)    Health Maintenance Due  Topic Date Due  . COVID-19 Vaccine (1) Never done  . HIV Screening  Never done  . TETANUS/TDAP  Never done  . PAP SMEAR-Modifier  Never done  . COLONOSCOPY (Pts 45-52yrs Insurance coverage will need to be confirmed)  Never done  . MAMMOGRAM  Never done    There are no preventive care reminders to display for this patient.   Lab Results  Component Value Date   TSH 2.630 11/05/2020   Lab Results  Component Value Date   WBC 5.2 11/05/2020   HGB 12.8 11/05/2020   HCT 37.7 11/05/2020   MCV 83 11/05/2020   PLT 304 11/05/2020   Lab Results  Component Value Date   NA 139 11/05/2020   K 3.9 11/05/2020   CO2 20 11/05/2020   GLUCOSE 90 11/05/2020   BUN 14 11/05/2020   CREATININE 0.52 (L) 11/05/2020   BILITOT 0.4 11/05/2020   ALKPHOS 106 11/05/2020   AST 32 11/05/2020   ALT 13 11/05/2020   PROT 7.0 11/05/2020   ALBUMIN 4.5 11/05/2020   CALCIUM 9.3 11/05/2020   ANIONGAP 12 06/21/2019   Lab Results  Component Value Date   CHOL 172 11/05/2020   Lab Results  Component Value Date   HDL 56 11/05/2020   Lab Results   Component Value Date   LDLCALC 98 11/05/2020   Lab Results  Component Value Date   TRIG 96 11/05/2020   Lab Results  Component Value Date   CHOLHDL 4.0 02/09/2020   Lab Results  Component Value Date   HGBA1C 5.2 12/07/2019       Assessment & Plan:   Problem List Items Addressed This Visit   None      No orders of the defined types were placed in this encounter.    12/09/2019, NP

## 2020-11-18 NOTE — Assessment & Plan Note (Addendum)
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 Prednisone -no red flags like tongue swelling or SOB -Rx. epipen for PRN use

## 2020-11-18 NOTE — Assessment & Plan Note (Signed)
-  she hasn't been in with a ortho doc yet and she would like a pain medication -Rx. Naproxen; she states she hasn't been taking this -would consider tramadol if naproxen doesn't help

## 2020-12-16 ENCOUNTER — Encounter (HOSPITAL_COMMUNITY): Payer: Self-pay | Admitting: Physical Therapy

## 2020-12-16 NOTE — Therapy (Signed)
[place], [place], [address] Phone: [phone]   Fax:  [phone]  Patient Details  Name: SHAKISHA ABEND MRN: [id number] Date of Birth: Sep 12, 1966 Referring Provider:  No ref. provider found  Encounter Date: 12/16/2020   PHYSICAL THERAPY DISCHARGE SUMMARY  Visits from Start of Care: 6  Current functional level related to goals / functional outcomes: Unknown as patient has not returned.   Remaining deficits: Unknown as patient has not returned.    Education / Equipment: HEP  Plan: Patient agrees to discharge.  Patient goals were not met. Patient is being discharged due to not returning since the last visit.  ?????    11:19 AM, 12/16/20 Mearl Latin PT, DPT Physical Therapist at [place], [place], [address] Phone: [phone]   Fax:  [phone]

## 2020-12-20 ENCOUNTER — Encounter: Payer: Self-pay | Admitting: Obstetrics & Gynecology

## 2020-12-20 ENCOUNTER — Other Ambulatory Visit (HOSPITAL_COMMUNITY)
Admission: RE | Admit: 2020-12-20 | Discharge: 2020-12-20 | Disposition: A | Discharge status: Home / Self Care | Payer: Medicaid Other | Source: Ambulatory Visit | Attending: Obstetrics & Gynecology | Admitting: Obstetrics & Gynecology

## 2020-12-20 ENCOUNTER — Ambulatory Visit: Payer: Medicaid Other | Admitting: Obstetrics & Gynecology

## 2020-12-20 ENCOUNTER — Other Ambulatory Visit: Payer: Self-pay

## 2020-12-20 VITALS — BP 128/82 | HR 70 | Ht 61.0 in | Wt 128.0 lb

## 2020-12-20 DIAGNOSIS — Z01419 Encounter for gynecological examination (general) (routine) without abnormal findings: Secondary | ICD-10-CM | POA: Diagnosis not present

## 2020-12-20 NOTE — Progress Notes (Signed)
Subjective:     Jennifer Wright is a 55 y.o. female here for a routine exam.  No LMP recorded. Patient has had a hysterectomy. G6P0 Birth Control Method:  hysterectomy Menstrual Calendar(currently): amenorrhea  Current complaints: none.   Current acute medical issues:  none   Recent Gynecologic History No LMP recorded. Patient has had a hysterectomy. Last Pap: unsure,   Last mammogram: 3 years ago,    Past Medical History:  Diagnosis Date  . Anxiety   . Asbestos exposure   . Asthma   . Depression   . Encounter for screening for malignant neoplasm of colon 12/07/2019  . Family history of diabetes mellitus 12/07/2019  . Family history of hyperlipidemia 12/07/2019  . Generalized headaches 12/07/2019  . GERD (gastroesophageal reflux disease)   . Hyperlipemia   . Neck and shoulder pain 12/07/2019  . Shortness of breath 12/07/2019  . Thyroid disease   . Upper back pain 12/07/2019    Past Surgical History:  Procedure Laterality Date  . ABDOMINAL HYSTERECTOMY    . TUBAL LIGATION      OB History    Gravida  6   Para      Term      Preterm      AB      Living  6     SAB      IAB      Ectopic      Multiple      Live Births  6           Social History   Socioeconomic History  . Marital status: Divorced    Spouse name: Not on file  . Number of children: 6  . Years of education: Not on file  . Highest education level: 1st grade  Occupational History  . Not on file  Tobacco Use  . Smoking status: Never Smoker  . Smokeless tobacco: Never Used  Vaping Use  . Vaping Use: Never used  Substance and Sexual Activity  . Alcohol use: Yes    Comment: socially  . Drug use: Never  . Sexual activity: Yes    Birth control/protection: Condom, Surgical  Other Topics Concern  . Not on file  Social History Narrative   Moved from [place] at 5 years old   6 children   Lives with daughter -Corrie Dandy and Rolinda Roan' and her two children       Enjoys working around the house       Diet: eats all food groups   Caffeine: coffee 2-3 cups   Water: 4-6 cups daily      Wears seat belt    Does not drive, but can.   Smoke detectors at home           Social Determinants of Health   Financial Resource Strain: Medium Risk  . Difficulty of Paying Living Expenses: Somewhat hard  Food Insecurity: Food Insecurity Present  . Worried About Programme researcher, broadcasting/film/video in the Last Year: Sometimes true  . Ran Out of Food in the Last Year: Sometimes true  Transportation Needs: No Transportation Needs  . Lack of Transportation (Medical): No  . Lack of Transportation (Non-Medical): No  Physical Activity: Insufficiently Active  . Days of Exercise per Week: 2 days  . Minutes of Exercise per Session: 30 min  Stress: No Stress Concern Present  . Feeling of Stress : Only a little  Social Connections: Socially Isolated  . Frequency of Communication with Friends and Family: More  than three times a week  . Frequency of Social Gatherings with Friends and Family: Twice a week  . Attends Religious Services: Never  . Active Member of Clubs or Organizations: No  . Attends BankerClub or Organization Meetings: Never  . Marital Status: Divorced    Family History  Problem Relation Age of Onset  . Cancer Mother        stomach cancer  . Cancer Brother        stomach cancer  . Colon cancer Neg Hx   . Colon polyps Neg Hx      Current Outpatient Medications:  .  albuterol (VENTOLIN HFA) 108 (90 Base) MCG/ACT inhaler, Inhale 1 puff into the lungs every 6 (six) hours as needed for wheezing or shortness of breath. Inhale 1 puff into the lungs every 6 (six) hours as needed for wheezing or shortness of breath., Disp: 18 g, Rfl: 0 .  budesonide-formoterol (SYMBICORT) 160-4.5 MCG/ACT inhaler, Inhale 2 puffs into the lungs daily., Disp: 1 each, Rfl: 0 .  busPIRone (BUSPAR) 5 MG tablet, Take 1 tablet (5 mg total) by mouth 2 (two) times daily., Disp: 60 tablet, Rfl: 3 .  EPINEPHrine 0.3 mg/0.3 mL IJ SOAJ  injection, Inject 0.3 mg into the muscle as needed for anaphylaxis., Disp: 1 each, Rfl: 1 .  fesoterodine (TOVIAZ) 8 MG TB24 tablet, Take 1 tablet (8 mg total) by mouth daily., Disp: 30 tablet, Rfl: 11 .  hyoscyamine (LEVSIN) 0.125 MG tablet, Take 1 tablet (0.125 mg total) by mouth every 6 (six) hours as needed for bladder spasms., Disp: 30 tablet, Rfl: 0 .  levothyroxine (SYNTHROID) 75 MCG tablet, Take 1 tablet (75 mcg total) by mouth daily before breakfast., Disp: 30 tablet, Rfl: 0 .  naproxen (NAPROSYN) 500 MG tablet, Take 1 tablet (500 mg total) by mouth 2 (two) times daily with a meal., Disp: 60 tablet, Rfl: 1 .  omeprazole (PRILOSEC) 20 MG capsule, Take 1 capsule (20 mg total) by mouth 2 (two) times daily before a meal., Disp: 60 capsule, Rfl: 2 .  rizatriptan (MAXALT-MLT) 10 MG disintegrating tablet, Take 1 tablet (10 mg total) by mouth as needed for migraine. May repeat in 2 hours if needed. Do not use with sumatriptan., Disp: 10 tablet, Rfl: 0 .  rosuvastatin (CRESTOR) 10 MG tablet, Take 1 tablet (10 mg total) by mouth daily., Disp: 90 tablet, Rfl: 0 .  tiotropium (SPIRIVA) 18 MCG inhalation capsule, Place 1 capsule (18 mcg total) into inhaler and inhale daily., Disp: 30 capsule, Rfl: 0 .  topiramate (TOPAMAX) 25 MG tablet, Take 1 tablet at bedtime for one week, then increase to 2 tablets at bedtime., Disp: 60 tablet, Rfl: 0 .  Vitamin D, Ergocalciferol, (DRISDOL) 1.25 MG (50000 UNIT) CAPS capsule, Take 1 capsule (50,000 Units total) by mouth every 7 (seven) days., Disp: 12 capsule, Rfl: 1 .  mirtazapine (REMERON) 15 MG tablet, Take 1 tablet (15 mg total) by mouth at bedtime. (Patient not taking: Reported on 12/20/2020), Disp: 30 tablet, Rfl: 0 .  ondansetron (ZOFRAN) 4 MG tablet, Take 1 tablet (4 mg total) by mouth every 6 (six) hours. (Patient not taking: Reported on 12/20/2020), Disp: 12 tablet, Rfl: 0 .  pantoprazole (PROTONIX) 40 MG tablet, Take 1 tablet (40 mg total) by mouth 2 (two) times  daily before a meal. (Patient not taking: Reported on 12/20/2020), Disp: 90 tablet, Rfl: 3 .  QUEtiapine (SEROQUEL) 100 MG tablet, Take 1 tablet (100 mg total) by mouth at bedtime. (Patient  not taking: Reported on 12/20/2020), Disp: 30 tablet, Rfl: 0 .  sertraline (ZOLOFT) 100 MG tablet, Take 1 tablet (100 mg total) by mouth daily. (Patient not taking: Reported on 12/20/2020), Disp: 30 tablet, Rfl: 3  Review of Systems  Review of Systems  Constitutional: Negative for fever, chills, weight loss, malaise/fatigue and diaphoresis.  HENT: Negative for hearing loss, ear pain, nosebleeds, congestion, sore throat, neck pain, tinnitus and ear discharge.   Eyes: Negative for blurred vision, double vision, photophobia, pain, discharge and redness.  Respiratory: Negative for cough, hemoptysis, sputum production, shortness of breath, wheezing and stridor.   Cardiovascular: Negative for chest pain, palpitations, orthopnea, claudication, leg swelling and PND.  Gastrointestinal: negative for abdominal pain. Negative for heartburn, nausea, vomiting, diarrhea, constipation, blood in stool and melena.  Genitourinary: Negative for dysuria, urgency, frequency, hematuria and flank pain.  Musculoskeletal: Negative for myalgias, back pain, joint pain and falls.  Skin: Negative for itching and rash.  Neurological: Negative for dizziness, tingling, tremors, sensory change, speech change, focal weakness, seizures, loss of consciousness, weakness and headaches.  Endo/Heme/Allergies: Negative for environmental allergies and polydipsia. Does not bruise/bleed easily.  Psychiatric/Behavioral: Negative for depression, suicidal ideas, hallucinations, memory loss and substance abuse. The patient is not nervous/anxious and does not have insomnia.        Objective:  Blood pressure 128/82, pulse 70, height 5\' 1"  (1.549 m), weight 128 lb (58.1 kg).   Physical Exam  Vitals reviewed. Constitutional: She is oriented to person, place, and  time. She appears well-developed and well-nourished.  HENT:  Head: Normocephalic and atraumatic.        Right Ear: External ear normal.  Left Ear: External ear normal.  Nose: Nose normal.  Mouth/Throat: Oropharynx is clear and moist.  Eyes: Conjunctivae and EOM are normal. Pupils are equal, round, and reactive to light. Right eye exhibits no discharge. Left eye exhibits no discharge. No scleral icterus.  Neck: Normal range of motion. Neck supple. No tracheal deviation present. No thyromegaly present.  Cardiovascular: Normal rate, regular rhythm, normal heart sounds and intact distal pulses.  Exam reveals no gallop and no friction rub.   No murmur heard. Respiratory: Effort normal and breath sounds normal. No respiratory distress. She has no wheezes. She has no rales. She exhibits no tenderness.  GI: Soft. Bowel sounds are normal. She exhibits no distension and no mass. There is no tenderness. There is no rebound and no guarding.  Genitourinary:  Breasts no masses skin changes or nipple changes bilaterally      Vulva is normal without lesions Vagina is pink moist without discharge Cervix normal in appearance and pap is done Uterus is absent Adnexa is negative with normal sized ovaries   Musculoskeletal: Normal range of motion. She exhibits no edema and no tenderness.  Neurological: She is alert and oriented to person, place, and time. She has normal reflexes. She displays normal reflexes. No cranial nerve deficit. She exhibits normal muscle tone. Coordination normal.  Skin: Skin is warm and dry. No rash noted. No erythema. No pallor.  Psychiatric: She has a normal mood and affect. Her behavior is normal. Judgment and thought content normal.       Medications Ordered at today's visit: No orders of the defined types were placed in this encounter.   Other orders placed at today's visit: No orders of the defined types were placed in this encounter.     Assessment:    Normal Gyn  exam.    Plan:  repeat HPV based cytology 5 years if normal     Return in about 5 years (around 12/20/2025) for pap testing.

## 2020-12-23 LAB — CYTOLOGY - PAP
Chlamydia: NEGATIVE
Comment: NEGATIVE
Comment: NEGATIVE
Comment: NORMAL
Diagnosis: NEGATIVE
High risk HPV: NEGATIVE
Neisseria Gonorrhea: NEGATIVE

## 2021-01-30 ENCOUNTER — Ambulatory Visit: Payer: Medicaid Other | Admitting: Gastroenterology

## 2021-02-05 ENCOUNTER — Ambulatory Visit (INDEPENDENT_AMBULATORY_CARE_PROVIDER_SITE_OTHER): Payer: Medicaid Other | Admitting: Internal Medicine

## 2021-02-05 ENCOUNTER — Other Ambulatory Visit: Payer: Self-pay

## 2021-02-05 ENCOUNTER — Encounter: Payer: Self-pay | Admitting: Internal Medicine

## 2021-02-05 VITALS — BP 112/73 | HR 80 | Temp 98.3°F | Resp 18 | Ht 62.0 in | Wt 127.8 lb

## 2021-02-05 DIAGNOSIS — Z1231 Encounter for screening mammogram for malignant neoplasm of breast: Secondary | ICD-10-CM

## 2021-02-05 DIAGNOSIS — G43009 Migraine without aura, not intractable, without status migrainosus: Secondary | ICD-10-CM

## 2021-02-05 MED ORDER — PROPRANOLOL HCL 20 MG PO TABS: 20.0000 mg | ORAL_TABLET | Freq: Two times a day (BID) | ORAL | 0 refills | Status: DC

## 2021-02-05 NOTE — Patient Instructions (Signed)
Please start taking Propranolol for migraine prophylaxis.  Please take Sumatriptan as needed for migraine.  You are being scheduled to get MRI of the brain.  Please contact Neurology office to discuss if you have persistent headaches.

## 2021-02-05 NOTE — Progress Notes (Signed)
Acute Office Visit  Subjective:    Patient ID: Jennifer Wright, female    DOB: 04/24/1966, 55 y.o.   MRN: [id number]  Chief Complaint  Patient presents with  . Headache    Pt has been getting headaches for about 2 minutes and little dizzy loses vision in the right eye when headaches come on she take sumatripton and this helps but once this wears off the headaches come right back     HPI Patient is in today for evaluation of persistent headaches, lasting from few hours to 4 days at a time. She has been having blurry vision at times and dizziness lasting for few hours. She had Neurology evaluation and was started on Topiramate in addition to PRN Sumatriptan. She did not tolerate Topiramate (nausea/Gi discomfort). She states that Sumatriptan helps with the pain up to certain limit, but she gets rebound headache after its effect wanes. She denies any recent injury. Denies any numbness or tingling of the UE or LE.  Past Medical History:  Diagnosis Date  . Anxiety   . Asbestos exposure   . Asthma   . Depression   . Encounter for screening for malignant neoplasm of colon 12/07/2019  . Family history of diabetes mellitus 12/07/2019  . Family history of hyperlipidemia 12/07/2019  . Generalized headaches 12/07/2019  . GERD (gastroesophageal reflux disease)   . Hyperlipemia   . Neck and shoulder pain 12/07/2019  . Shortness of breath 12/07/2019  . Thyroid disease   . Upper back pain 12/07/2019    Past Surgical History:  Procedure Laterality Date  . ABDOMINAL HYSTERECTOMY    . TUBAL LIGATION      Family History  Problem Relation Age of Onset  . Cancer Mother        stomach cancer  . Cancer Brother        stomach cancer  . Colon cancer Neg Hx   . Colon polyps Neg Hx     Social History   Socioeconomic History  . Marital status: Divorced    Spouse name: Not on file  . Number of children: 6  . Years of education: Not on file  . Highest education level: 1st grade  Occupational  History  . Not on file  Tobacco Use  . Smoking status: Never Smoker  . Smokeless tobacco: Never Used  Vaping Use  . Vaping Use: Never used  Substance and Sexual Activity  . Alcohol use: Yes    Comment: socially  . Drug use: Never  . Sexual activity: Yes    Birth control/protection: Condom, Surgical  Other Topics Concern  . Not on file  Social History Narrative   Moved from [place] at 65 years old   6 children   Lives with daughter -Corrie Dandy and Rolinda Roan' and her two children       Enjoys working around the house      Diet: eats all food groups   Caffeine: coffee 2-3 cups   Water: 4-6 cups daily      Wears seat belt    Does not drive, but can.   Smoke detectors at home           Social Determinants of Health   Financial Resource Strain: Medium Risk  . Difficulty of Paying Living Expenses: Somewhat hard  Food Insecurity: Food Insecurity Present  . Worried About Programme researcher, broadcasting/film/video in the Last Year: Sometimes true  . Ran Out of Food in the Last Year: Sometimes true  Transportation  Needs: No Transportation Needs  . Lack of Transportation (Medical): No  . Lack of Transportation (Non-Medical): No  Physical Activity: Insufficiently Active  . Days of Exercise per Week: 2 days  . Minutes of Exercise per Session: 30 min  Stress: No Stress Concern Present  . Feeling of Stress : Only a little  Social Connections: Socially Isolated  . Frequency of Communication with Friends and Family: More than three times a week  . Frequency of Social Gatherings with Friends and Family: Twice a week  . Attends Religious Services: Never  . Active Member of Clubs or Organizations: No  . Attends BankerClub or Organization Meetings: Never  . Marital Status: Divorced  Catering managerntimate Partner Violence: Not At Risk  . Fear of Current or Ex-Partner: No  . Emotionally Abused: No  . Physically Abused: No  . Sexually Abused: No    Outpatient Medications Prior to Visit  Medication Sig Dispense Refill  .  albuterol (VENTOLIN HFA) 108 (90 Base) MCG/ACT inhaler Inhale 1 puff into the lungs every 6 (six) hours as needed for wheezing or shortness of breath. Inhale 1 puff into the lungs every 6 (six) hours as needed for wheezing or shortness of breath. 18 g 0  . budesonide-formoterol (SYMBICORT) 160-4.5 MCG/ACT inhaler Inhale 2 puffs into the lungs daily. 1 each 0  . busPIRone (BUSPAR) 5 MG tablet Take 1 tablet (5 mg total) by mouth 2 (two) times daily. 60 tablet 3  . EPINEPHrine 0.3 mg/0.3 mL IJ SOAJ injection Inject 0.3 mg into the muscle as needed for anaphylaxis. 1 each 1  . fesoterodine (TOVIAZ) 8 MG TB24 tablet Take 1 tablet (8 mg total) by mouth daily. 30 tablet 11  . hyoscyamine (LEVSIN) 0.125 MG tablet Take 1 tablet (0.125 mg total) by mouth every 6 (six) hours as needed for bladder spasms. 30 tablet 0  . levothyroxine (SYNTHROID) 75 MCG tablet Take 1 tablet (75 mcg total) by mouth daily before breakfast. 30 tablet 0  . mirtazapine (REMERON) 15 MG tablet Take 1 tablet (15 mg total) by mouth at bedtime. 30 tablet 0  . naproxen (NAPROSYN) 500 MG tablet Take 1 tablet (500 mg total) by mouth 2 (two) times daily with a meal. 60 tablet 1  . omeprazole (PRILOSEC) 20 MG capsule Take 1 capsule (20 mg total) by mouth 2 (two) times daily before a meal. 60 capsule 2  . ondansetron (ZOFRAN) 4 MG tablet Take 1 tablet (4 mg total) by mouth every 6 (six) hours. 12 tablet 0  . pantoprazole (PROTONIX) 40 MG tablet Take 1 tablet (40 mg total) by mouth 2 (two) times daily before a meal. 90 tablet 3  . QUEtiapine (SEROQUEL) 100 MG tablet Take 1 tablet (100 mg total) by mouth at bedtime. 30 tablet 0  . rizatriptan (MAXALT-MLT) 10 MG disintegrating tablet Take 1 tablet (10 mg total) by mouth as needed for migraine. May repeat in 2 hours if needed. Do not use with sumatriptan. 10 tablet 0  . rosuvastatin (CRESTOR) 10 MG tablet Take 1 tablet (10 mg total) by mouth daily. 90 tablet 0  . sertraline (ZOLOFT) 100 MG tablet  Take 1 tablet (100 mg total) by mouth daily. 30 tablet 3  . tiotropium (SPIRIVA) 18 MCG inhalation capsule Place 1 capsule (18 mcg total) into inhaler and inhale daily. 30 capsule 0  . topiramate (TOPAMAX) 25 MG tablet Take 1 tablet at bedtime for one week, then increase to 2 tablets at bedtime. 60 tablet 0  . Vitamin  D, Ergocalciferol, (DRISDOL) 1.25 MG (50000 UNIT) CAPS capsule Take 1 capsule (50,000 Units total) by mouth every 7 (seven) days. 12 capsule 1  . SUMAtriptan (IMITREX) 100 MG tablet Take by mouth.     No facility-administered medications prior to visit.    No Known Allergies  Review of Systems  Constitutional: Negative for chills and fever.  HENT: Negative for congestion, sinus pressure, sinus pain and sore throat.   Eyes: Positive for visual disturbance. Negative for pain and discharge.  Respiratory: Negative for cough and shortness of breath.   Cardiovascular: Negative for chest pain and palpitations.  Gastrointestinal: Negative for abdominal pain, constipation, diarrhea, nausea and vomiting.  Endocrine: Negative for polydipsia and polyuria.  Genitourinary: Negative for dysuria and hematuria.  Musculoskeletal: Negative for neck pain and neck stiffness.  Skin: Negative for rash.  Neurological: Positive for dizziness and headaches. Negative for seizures, weakness and numbness.  Psychiatric/Behavioral: Negative for agitation and behavioral problems.       Objective:    Physical Exam Vitals reviewed.  Constitutional:      General: She is not in acute distress.    Appearance: She is not diaphoretic.  HENT:     Head: Normocephalic and atraumatic.     Nose: Nose normal.     Mouth/Throat:     Mouth: Mucous membranes are moist.  Eyes:     General: No scleral icterus.    Extraocular Movements: Extraocular movements intact.  Cardiovascular:     Rate and Rhythm: Normal rate and regular rhythm.     Pulses: Normal pulses.     Heart sounds: No murmur  heard.   Pulmonary:     Breath sounds: Normal breath sounds. No wheezing or rales.  Musculoskeletal:     Cervical back: Neck supple. No tenderness.     Right lower leg: No edema.     Left lower leg: No edema.  Skin:    General: Skin is warm.     Findings: No rash.  Neurological:     General: No focal deficit present.     Mental Status: She is alert and oriented to person, place, and time.     Cranial Nerves: No cranial nerve deficit.     Sensory: No sensory deficit.     Motor: No weakness.     Gait: Gait normal.  Psychiatric:        Mood and Affect: Mood normal.        Behavior: Behavior normal.     BP 112/73 (BP Location: Right Arm, Patient Position: Sitting, Cuff Size: Normal)   Pulse 80   Temp 98.3 F (36.8 C) (Oral)   Resp 18   Ht 5\' 2"  (1.575 m)   Wt 127 lb 12.8 oz (58 kg)   SpO2 99%   BMI 23.37 kg/m  Wt Readings from Last 3 Encounters:  02/05/21 127 lb 12.8 oz (58 kg)  12/20/20 128 lb (58.1 kg)  11/18/20 124 lb (56.2 kg)    Health Maintenance Due  Topic Date Due  . COVID-19 Vaccine (1) Never done  . HIV Screening  Never done  . TETANUS/TDAP  Never done  . COLONOSCOPY (Pts 45-34yrs Insurance coverage will need to be confirmed)  Never done  . MAMMOGRAM  Never done    There are no preventive care reminders to display for this patient.   Lab Results  Component Value Date   TSH 2.630 11/05/2020   Lab Results  Component Value Date   WBC 5.2 11/05/2020  HGB 12.8 11/05/2020   HCT 37.7 11/05/2020   MCV 83 11/05/2020   PLT 304 11/05/2020   Lab Results  Component Value Date   NA 139 11/05/2020   K 3.9 11/05/2020   CO2 20 11/05/2020   GLUCOSE 90 11/05/2020   BUN 14 11/05/2020   CREATININE 0.52 (L) 11/05/2020   BILITOT 0.4 11/05/2020   ALKPHOS 106 11/05/2020   AST 32 11/05/2020   ALT 13 11/05/2020   PROT 7.0 11/05/2020   ALBUMIN 4.5 11/05/2020   CALCIUM 9.3 11/05/2020   ANIONGAP 12 06/21/2019   Lab Results  Component Value Date   CHOL 172  11/05/2020   Lab Results  Component Value Date   HDL 56 11/05/2020   Lab Results  Component Value Date   LDLCALC 98 11/05/2020   Lab Results  Component Value Date   TRIG 96 11/05/2020   Lab Results  Component Value Date   CHOLHDL 4.0 02/09/2020   Lab Results  Component Value Date   HGBA1C 5.2 12/07/2019       Assessment & Plan:   Problem List Items Addressed This Visit      Cardiovascular and Mediastinum   Migraine without aura and without status migrainosus, not intractable - Primary Headaches with new onset episodes of blurry vision and dizziness Check MRI brain Follow up with Neurology Started Propranolol for ppx as she did not tolerate Topiramate Sumatriptan PRN   Relevant Medications   SUMAtriptan (IMITREX) 100 MG tablet   propranolol (INDERAL) 20 MG tablet   Other Relevant Orders   MR Brain Wo Contrast    Other Visit Diagnoses    Screening mammogram for breast cancer       Relevant Orders   MM 3D SCREEN BREAST BILATERAL       Meds ordered this encounter  Medications  . propranolol (INDERAL) 20 MG tablet    Sig: Take 1 tablet (20 mg total) by mouth 2 (two) times daily.    Dispense:  60 tablet    Refill:  0     Haley Fuerstenberg Concha Se, MD

## 2021-02-12 ENCOUNTER — Ambulatory Visit (HOSPITAL_COMMUNITY): Payer: Medicaid Other

## 2021-02-14 ENCOUNTER — Other Ambulatory Visit: Payer: Self-pay

## 2021-02-14 ENCOUNTER — Emergency Department (HOSPITAL_COMMUNITY)
Admission: EM | Admit: 2021-02-14 | Discharge: 2021-02-14 | Disposition: A | Discharge status: Home / Self Care | Payer: Medicaid Other | Attending: Emergency Medicine | Admitting: Emergency Medicine

## 2021-02-14 ENCOUNTER — Encounter (HOSPITAL_COMMUNITY): Payer: Self-pay

## 2021-02-14 DIAGNOSIS — R5383 Other fatigue: Secondary | ICD-10-CM | POA: Insufficient documentation

## 2021-02-14 DIAGNOSIS — R110 Nausea: Secondary | ICD-10-CM | POA: Diagnosis not present

## 2021-02-14 DIAGNOSIS — R531 Weakness: Secondary | ICD-10-CM | POA: Diagnosis present

## 2021-02-14 DIAGNOSIS — E039 Hypothyroidism, unspecified: Secondary | ICD-10-CM | POA: Diagnosis not present

## 2021-02-14 DIAGNOSIS — R197 Diarrhea, unspecified: Secondary | ICD-10-CM | POA: Diagnosis not present

## 2021-02-14 DIAGNOSIS — Z7951 Long term (current) use of inhaled steroids: Secondary | ICD-10-CM | POA: Insufficient documentation

## 2021-02-14 DIAGNOSIS — Z79899 Other long term (current) drug therapy: Secondary | ICD-10-CM | POA: Diagnosis not present

## 2021-02-14 DIAGNOSIS — J45909 Unspecified asthma, uncomplicated: Secondary | ICD-10-CM | POA: Insufficient documentation

## 2021-02-14 LAB — URINALYSIS, ROUTINE W REFLEX MICROSCOPIC
Bilirubin Urine: NEGATIVE
Glucose, UA: NEGATIVE mg/dL
Hgb urine dipstick: NEGATIVE
Ketones, ur: NEGATIVE mg/dL
Leukocytes,Ua: NEGATIVE
Nitrite: NEGATIVE
Protein, ur: NEGATIVE mg/dL
Specific Gravity, Urine: 1.005 (ref 1.005–1.030)
pH: 7 (ref 5.0–8.0)

## 2021-02-14 LAB — COMPREHENSIVE METABOLIC PANEL
ALT: 43 U/L (ref 0–44)
AST: 53 U/L — ABNORMAL HIGH (ref 15–41)
Albumin: 4.2 g/dL (ref 3.5–5.0)
Alkaline Phosphatase: 113 U/L (ref 38–126)
Anion gap: 8 (ref 5–15)
BUN: 7 mg/dL (ref 6–20)
CO2: 26 mmol/L (ref 22–32)
Calcium: 8.7 mg/dL — ABNORMAL LOW (ref 8.9–10.3)
Chloride: 102 mmol/L (ref 98–111)
Creatinine, Ser: 0.44 mg/dL (ref 0.44–1.00)
GFR, Estimated: >60 mL/min (ref >60)
Glucose, Bld: 104 mg/dL — ABNORMAL HIGH (ref 70–99)
Potassium: 3.5 mmol/L (ref 3.5–5.1)
Sodium: 136 mmol/L (ref 135–145)
Total Bilirubin: 0.7 mg/dL (ref 0.3–1.2)
Total Protein: 7.3 g/dL (ref 6.5–8.1)

## 2021-02-14 LAB — CBC WITH DIFFERENTIAL/PLATELET
Abs Immature Granulocytes: 0.02 10*3/uL (ref 0.00–0.07)
Basophils Absolute: 0 10*3/uL (ref 0.0–0.1)
Basophils Relative: 1 %
Eosinophils Absolute: 0.1 10*3/uL (ref 0.0–0.5)
Eosinophils Relative: 1 %
HCT: 36.2 % (ref 36.0–46.0)
Hemoglobin: 12.5 g/dL (ref 12.0–15.0)
Immature Granulocytes: 0 %
Lymphocytes Relative: 45 %
Lymphs Abs: 2.5 10*3/uL (ref 0.7–4.0)
MCH: 28.9 pg (ref 26.0–34.0)
MCHC: 34.5 g/dL (ref 30.0–36.0)
MCV: 83.6 fL (ref 80.0–100.0)
Monocytes Absolute: 0.5 10*3/uL (ref 0.1–1.0)
Monocytes Relative: 9 %
Neutro Abs: 2.4 10*3/uL (ref 1.7–7.7)
Neutrophils Relative %: 44 %
Platelets: 283 10*3/uL (ref 150–400)
RBC: 4.33 MIL/uL (ref 3.87–5.11)
RDW: 12.7 % (ref 11.5–15.5)
WBC: 5.5 10*3/uL (ref 4.0–10.5)
nRBC: 0 % (ref 0.0–0.2)

## 2021-02-14 LAB — LIPASE, BLOOD: Lipase: 55 U/L — ABNORMAL HIGH (ref 11–51)

## 2021-02-14 MED ORDER — SODIUM CHLORIDE 0.9 % IV BOLUS
1000.0000 mL | Freq: Once | INTRAVENOUS | Status: AC
  Administered 2021-02-14: 1000 mL via INTRAVENOUS

## 2021-02-14 MED ORDER — ONDANSETRON 4 MG PO TBDP: 4.0000 mg | ORAL_TABLET | Freq: Three times a day (TID) | ORAL | 0 refills | Status: DC | PRN

## 2021-02-14 NOTE — ED Notes (Signed)
Patient given a Sprite

## 2021-02-14 NOTE — ED Provider Notes (Signed)
[facility] EMERGENCY DEPARTMENT Provider Note   CSN: [id number] Arrival date & time: 02/14/21  0944     History Chief Complaint  Patient presents with  . Weakness    Jennifer Wright is a 55 y.o. female with a history of GERD, hyperlipidemia, and anxiety, also hypothyroidism presenting with a 4-day history of nausea without emesis and diarrhea.  She endorses for episodes of nonbloody diarrhea on average for the past 4 days.  She denies fevers or chills, she does endorse generalized nausea, denies abdominal pain.  She does report increasing weakness and fatigue.  She has had no exposures to others with similar symptoms to her knowledge, she works in a [employer].  She has been able to sip fluids at home but feels she is dehydrated.  She has had no treatment for her symptoms including any antidiarrheal medications.  Her last episode of diarrhea was actually last night.  She has had no p.o. intake since yesterday.  HPI     Past Medical History:  Diagnosis Date  . Anxiety   . Asbestos exposure   . Asthma   . Depression   . Encounter for screening for malignant neoplasm of colon 12/07/2019  . Family history of diabetes mellitus 12/07/2019  . Family history of hyperlipidemia 12/07/2019  . Generalized headaches 12/07/2019  . GERD (gastroesophageal reflux disease)   . Hyperlipemia   . Neck and shoulder pain 12/07/2019  . Shortness of breath 12/07/2019  . Thyroid disease   . Upper back pain 12/07/2019    Patient Active Problem List   Diagnosis Date Noted  . Allergy history, drug 11/18/2020  . Dry mouth 11/04/2020  . Right wrist pain 11/04/2020  . Migraine without aura and without status migrainosus, not intractable 08/01/2020  . Need for immunization against influenza 08/01/2020  . Encounter for screening for malignant neoplasm of cervix 08/01/2020  . Encounter for screening mammogram for malignant neoplasm of breast 08/01/2020  . RUQ pain 05/10/2020  . Dizziness 05/07/2020   . Facial numbness 05/07/2020  . Weakness of both lower extremities 05/07/2020  . Acute left-sided low back pain with left-sided sciatica 03/13/2020  . Overactive bladder 02/13/2020  . Acute pain of left knee 02/13/2020  . Left hip pain 02/13/2020  . Gastroesophageal reflux disease 02/13/2020  . Urgency of urination 02/13/2020  . Mixed hyperlipidemia 02/09/2020  . Vitamin D deficiency 12/07/2019  . Hypothyroidism 12/07/2019  . Iron deficiency 12/07/2019  . Depression, major, single episode, moderate (HCC) 12/07/2019  . Shortness of breath 12/07/2019  . Language barrier to communication 12/07/2019    Past Surgical History:  Procedure Laterality Date  . ABDOMINAL HYSTERECTOMY    . TUBAL LIGATION       OB History    Gravida  6   Para      Term      Preterm      AB      Living  6     SAB      IAB      Ectopic      Multiple      Live Births  6           Family History  Problem Relation Age of Onset  . Cancer Mother        stomach cancer  . Cancer Brother        stomach cancer  . Colon cancer Neg Hx   . Colon polyps Neg Hx     Social History  Tobacco Use  . Smoking status: Never Smoker  . Smokeless tobacco: Never Used  Vaping Use  . Vaping Use: Never used  Substance Use Topics  . Alcohol use: Yes    Comment: socially  . Drug use: Never    Home Medications Prior to Admission medications   Medication Sig Start Date End Date Taking? Authorizing Provider  albuterol (VENTOLIN HFA) 108 (90 Base) MCG/ACT inhaler Inhale 1 puff into the lungs every 6 (six) hours as needed for wheezing or shortness of breath. Inhale 1 puff into the lungs every 6 (six) hours as needed for wheezing or shortness of breath. 11/04/20  Yes Heather Roberts, NP  budesonide-formoterol [facility]) 160-4.5 MCG/ACT inhaler Inhale 2 puffs into the lungs daily. 11/04/20  Yes Heather Roberts, NP  busPIRone (BUSPAR) 5 MG tablet Take 1 tablet (5 mg total) by mouth 2 (two) times daily.  05/07/20  Yes Freddy Finner, NP  EPINEPHrine 0.3 mg/0.3 mL IJ SOAJ injection Inject 0.3 mg into the muscle as needed for anaphylaxis. 11/18/20  Yes Heather Roberts, NP  fesoterodine (TOVIAZ) 8 MG TB24 tablet Take 1 tablet (8 mg total) by mouth daily. 06/25/20  Yes Dahlstedt, Jeannett Senior, MD  hyoscyamine (LEVSIN) 0.125 MG tablet Take 1 tablet (0.125 mg total) by mouth every 6 (six) hours as needed for bladder spasms. 06/17/20  Yes Freddy Finner, NP  levothyroxine (SYNTHROID) 75 MCG tablet Take 1 tablet (75 mcg total) by mouth daily before breakfast. 11/04/20  Yes Heather Roberts, NP  mirtazapine (REMERON) 15 MG tablet Take 1 tablet (15 mg total) by mouth at bedtime. 08/17/20  Yes Haskel Schroeder, PA-C  naproxen (NAPROSYN) 500 MG tablet Take 1 tablet (500 mg total) by mouth 2 (two) times daily with a meal. 11/18/20  Yes Heather Roberts, NP  omeprazole (PRILOSEC) 20 MG capsule Take 1 capsule (20 mg total) by mouth 2 (two) times daily before a meal. 03/13/20  Yes Freddy Finner, NP  ondansetron (ZOFRAN ODT) 4 MG disintegrating tablet Take 1 tablet (4 mg total) by mouth every 8 (eight) hours as needed for nausea or vomiting. 02/14/21  Yes Eileen Kangas, Raynelle Fanning, PA-C  ondansetron (ZOFRAN) 4 MG tablet Take 1 tablet (4 mg total) by mouth every 6 (six) hours. 08/17/20  Yes Haskel Schroeder, PA-C  pantoprazole (PROTONIX) 40 MG tablet Take 1 tablet (40 mg total) by mouth 2 (two) times daily before a meal. 05/10/20  Yes Gelene Mink, NP  propranolol (INDERAL) 20 MG tablet Take 1 tablet (20 mg total) by mouth 2 (two) times daily. 02/05/21  Yes Anabel Halon, MD  QUEtiapine (SEROQUEL) 100 MG tablet Take 1 tablet (100 mg total) by mouth at bedtime. 08/17/20  Yes Haskel Schroeder, PA-C  rizatriptan (MAXALT-MLT) 10 MG disintegrating tablet Take 1 tablet (10 mg total) by mouth as needed for migraine. May repeat in 2 hours if needed. Do not use with sumatriptan. 11/04/20  Yes Heather Roberts, NP  rosuvastatin (CRESTOR) 10 MG  tablet Take 1 tablet (10 mg total) by mouth daily. 11/04/20  Yes Heather Roberts, NP  tiotropium (SPIRIVA) 18 MCG inhalation capsule Place 1 capsule (18 mcg total) into inhaler and inhale daily. 02/20/20  Yes Freddy Finner, NP  topiramate (TOPAMAX) 25 MG tablet Take 1 tablet at bedtime for one week, then increase to 2 tablets at bedtime. 09/19/20  Yes Jaffe, Adam R, DO  Vitamin D, Ergocalciferol, (DRISDOL) 1.25 MG (50000 UNIT) CAPS capsule Take 1  capsule (50,000 Units total) by mouth every 7 (seven) days. 03/13/20  Yes Freddy FinnerMills, Hannah M, NP  sertraline (ZOLOFT) 100 MG tablet Take 1 tablet (100 mg total) by mouth daily. Patient not taking: No sig reported 05/07/20   Freddy FinnerMills, Hannah M, NP    Allergies    Patient has no known allergies.  Review of Systems   Review of Systems  Constitutional: Positive for fatigue. Negative for chills and fever.  HENT: Negative for congestion and sore throat.   Eyes: Negative.   Respiratory: Negative for chest tightness and shortness of breath.   Cardiovascular: Negative for chest pain.  Gastrointestinal: Positive for diarrhea and nausea. Negative for abdominal distention, abdominal pain and vomiting.  Genitourinary: Negative.  Negative for dysuria.  Musculoskeletal: Negative for arthralgias, joint swelling and neck pain.  Skin: Negative.  Negative for rash and wound.  Neurological: Positive for weakness. Negative for dizziness, light-headedness, numbness and headaches.  Psychiatric/Behavioral: Negative.   All other systems reviewed and are negative.   Physical Exam Updated Vital Signs BP 123/82   Pulse 64   Temp 98.2 F (36.8 C) (Oral)   Resp 14   Ht 5\' 2"  (1.575 m)   Wt 59 kg   SpO2 99%   BMI 23.78 kg/m   Physical Exam Vitals and nursing note reviewed.  Constitutional:      Appearance: She is well-developed.  HENT:     Head: Normocephalic and atraumatic.     Mouth/Throat:     Mouth: Mucous membranes are moist.  Eyes:     Conjunctiva/sclera:  Conjunctivae normal.  Cardiovascular:     Rate and Rhythm: Normal rate and regular rhythm.     Heart sounds: Normal heart sounds.  Pulmonary:     Effort: Pulmonary effort is normal.     Breath sounds: Normal breath sounds. No wheezing.  Abdominal:     General: Bowel sounds are normal. There is no distension.     Palpations: Abdomen is soft. There is no mass.     Tenderness: There is no abdominal tenderness. There is no guarding or rebound.     Comments: Benign abdomen  Musculoskeletal:        General: Normal range of motion.     Cervical back: Normal range of motion.  Skin:    General: Skin is warm and dry.  Neurological:     Mental Status: She is alert.     ED Results / Procedures / Treatments   Labs (all labs ordered are listed, but only abnormal results are displayed) Labs Reviewed  COMPREHENSIVE METABOLIC PANEL - Abnormal; Notable for the following components:      Result Value   Glucose, Bld 104 (*)    Calcium 8.7 (*)    AST 53 (*)    All other components within normal limits  LIPASE, BLOOD - Abnormal; Notable for the following components:   Lipase 55 (*)    All other components within normal limits  URINALYSIS, ROUTINE W REFLEX MICROSCOPIC - Abnormal; Notable for the following components:   Color, Urine STRAW (*)    All other components within normal limits  CBC WITH DIFFERENTIAL/PLATELET    EKG EKG Interpretation  Date/Time:  Friday Feb 14 2021 10:00:34 EDT Ventricular Rate:  63 PR Interval:  174 QRS Duration: 96 QT Interval:  418 QTC Calculation: 428 R Axis:   40 Text Interpretation: Sinus rhythm RSR' in V1 or V2, right VCD or RVH No old tracing to compare Confirmed by Mancel BaleWentz, Elliott [phone]([id number])  on 02/14/2021 10:45:13 AM   Radiology No results found.  Procedures Procedures   Medications Ordered in ED Medications  sodium chloride 0.9 % bolus 1,000 mL (0 mLs Intravenous Stopped 02/14/21 1309)    ED Course  I have reviewed the triage vital signs and  the nursing notes.  Pertinent labs & imaging results that were available during my care of the patient were reviewed by me and considered in my medical decision making (see chart for details).    MDM Rules/Calculators/A&P                          Patient was given a liter of IV fluids while here.  She was offered Zofran however she denied nausea at this time.  Labs are reassuring, repeat exam prior to discharge with still nonacute abdomen, no guarding.  She states she felt much improved after receiving IV fluids.  We completed orthostatics and she is negative for orthostasis.  Advised increase fluid intake at home, Zofran prescribed, as needed Imodium if the diarrhea returns, I suspect this is a viral illness, plans to follow-up with her PCP for recheck of his symptoms persist into next week. Final Clinical Impression(s) / ED Diagnoses Final diagnoses:  Weakness  Diarrhea, unspecified type    Rx / DC Orders ED Discharge Orders         Ordered    ondansetron (ZOFRAN ODT) 4 MG disintegrating tablet  Every 8 hours PRN        02/14/21 1514           Burgess Amor, PA-C 02/14/21 1521    Mancel Bale, MD 02/15/21 1157

## 2021-02-14 NOTE — Discharge Instructions (Addendum)
Tests are reassuring and since you have not had any diarrhea since yesterday you do not need any further medication for this unless the symptom returns at which time I recommend taking Imodium, you do not need a prescription for this medication.  I have prescribed you some Zofran for use if your nausea returns.  Rest make sure you are drinking plenty of fluids your exam and your lab tests are reassuring today.  Get rechecked if you have any worsening symptoms or symptoms that persist beyond the next several days.

## 2021-02-14 NOTE — ED Triage Notes (Signed)
Pt presents to ED with complaints of generalized weakness, dizziness, nausea and diarrhea x 4 days.

## 2021-02-14 NOTE — ED Notes (Signed)
Pt in bed, pt reports no pain and states that her dizziness almost gone, pt states that she walked to the bathroom without assistance.

## 2021-03-04 ENCOUNTER — Other Ambulatory Visit: Payer: Self-pay | Admitting: Nurse Practitioner

## 2021-03-04 ENCOUNTER — Ambulatory Visit (HOSPITAL_COMMUNITY)
Admission: RE | Admit: 2021-03-04 | Discharge: 2021-03-04 | Disposition: A | Discharge status: Home / Self Care | Payer: Medicaid Other | Source: Ambulatory Visit | Attending: Internal Medicine | Admitting: Internal Medicine

## 2021-03-04 ENCOUNTER — Other Ambulatory Visit: Payer: Self-pay

## 2021-03-04 ENCOUNTER — Other Ambulatory Visit: Payer: Self-pay | Admitting: Gastroenterology

## 2021-03-04 DIAGNOSIS — G43009 Migraine without aura, not intractable, without status migrainosus: Secondary | ICD-10-CM | POA: Diagnosis present

## 2021-03-05 ENCOUNTER — Other Ambulatory Visit: Payer: Self-pay | Admitting: Internal Medicine

## 2021-03-05 DIAGNOSIS — G43009 Migraine without aura, not intractable, without status migrainosus: Secondary | ICD-10-CM

## 2021-03-05 MED ORDER — SUMATRIPTAN SUCCINATE 100 MG PO TABS: 100.0000 mg | ORAL_TABLET | ORAL | 2 refills | Status: DC | PRN

## 2021-03-10 ENCOUNTER — Ambulatory Visit (HOSPITAL_COMMUNITY): Payer: Medicaid Other
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
## 2021-03-27 ENCOUNTER — Ambulatory Visit: Payer: Medicaid Other | Admitting: Neurology

## 2021-03-27 ENCOUNTER — Other Ambulatory Visit: Payer: Self-pay

## 2021-03-27 ENCOUNTER — Encounter: Payer: Self-pay | Admitting: Neurology

## 2021-03-27 VITALS — BP 104/69 | HR 72 | Ht 62.0 in | Wt 133.0 lb

## 2021-03-27 DIAGNOSIS — G43009 Migraine without aura, not intractable, without status migrainosus: Secondary | ICD-10-CM | POA: Diagnosis not present

## 2021-03-27 NOTE — Patient Instructions (Signed)
Continue propranolol 20mg  twice daily  Take sumatriptan as needed.  Limit use of pain relievers to no more than 2 days out of week to prevent risk of rebound or medication-overuse headache. Keep headache diary Follow up 6 months.   1. Contine con propranolol 20 mg dos veces al da 2. Tome sumatriptn segn sea necesario. Limite el uso de analgsicos a no ms de 2 a la semana para [address]  [address] riesgo de dolor de Multimedia programmer de rebote o por uso excesivo de medicamentos. 3. Mantenga un diario de dolor de cabeza 4. Seguimiento 6 meses.

## 2021-04-16 ENCOUNTER — Other Ambulatory Visit: Payer: Self-pay | Admitting: Neurology

## 2021-04-16 DIAGNOSIS — G43009 Migraine without aura, not intractable, without status migrainosus: Secondary | ICD-10-CM

## 2021-05-14 ENCOUNTER — Encounter: Payer: Medicaid Other | Admitting: Family Medicine

## 2021-05-14 ENCOUNTER — Encounter: Payer: Medicaid Other | Admitting: Nurse Practitioner

## 2021-05-20 ENCOUNTER — Ambulatory Visit: Payer: Medicaid Other | Admitting: Gastroenterology

## 2021-06-06 ENCOUNTER — Other Ambulatory Visit: Payer: Self-pay

## 2021-06-06 ENCOUNTER — Encounter: Payer: Self-pay | Admitting: Nurse Practitioner

## 2021-06-06 ENCOUNTER — Ambulatory Visit (INDEPENDENT_AMBULATORY_CARE_PROVIDER_SITE_OTHER): Payer: Medicaid Other | Admitting: Nurse Practitioner

## 2021-06-06 VITALS — BP 127/75 | HR 72 | Temp 98.2°F | Ht 62.0 in | Wt 131.0 lb

## 2021-06-06 DIAGNOSIS — E782 Mixed hyperlipidemia: Secondary | ICD-10-CM | POA: Diagnosis not present

## 2021-06-06 DIAGNOSIS — G43009 Migraine without aura, not intractable, without status migrainosus: Secondary | ICD-10-CM

## 2021-06-06 DIAGNOSIS — E039 Hypothyroidism, unspecified: Secondary | ICD-10-CM

## 2021-06-06 MED ORDER — EMGALITY 120 MG/ML ~~LOC~~ SOAJ: 120.0000 mg | SUBCUTANEOUS | 11 refills | Status: DC

## 2021-06-06 MED ORDER — EMGALITY 120 MG/ML ~~LOC~~ SOAJ: 240.0000 mg | SUBCUTANEOUS | 0 refills | Status: AC

## 2021-06-06 NOTE — Assessment & Plan Note (Addendum)
-  has been followed by neurology, but next appt. Is in January -reviewed previous MRI, which was negative on 03/04/21 -Rx. Emgality, preferred on medicaid formulary -loading dose 240 mg ordered; 120 mg monthly thereafter -is taking imitrex, but is still having daily symptoms  -refilled topamax and propranolol -med check in 1 month

## 2021-06-06 NOTE — Progress Notes (Signed)
Acute Office Visit  Subjective:    Patient ID: Jennifer Wright, female    DOB: August 16, 1966, 55 y.o.   MRN: [id number]  Chief Complaint  Patient presents with   Headache    Ongoing x2 months, has been worsening since last visit. Had an episode on Monday where the pain was so bad she feels like she lost consciousness for a minute.     Headache   Patient is in today for headaches that are recurring. They are frequent and strong.  She was referred to neurology, and she states that the medicines were never sent to the pharmacy that he prescribed. She has imitrex at home.  Her pain is all over her head, like a "crown of pain". She rates her pain at 9-10/10.  She states that she loses consciousness. She states that happened 2-3 times. She explains the episodes are like she doesn't realize when her relatives come home, and she loses the ability to focus.  She states her face gets red, her glasses fog up and it feels like "my eyes are about to explode". She has a lot of sweating, but only on my head. She states she had a negative MRI.  Past Medical History:  Diagnosis Date   Anxiety    Asbestos exposure    Asthma    Depression    Encounter for screening for malignant neoplasm of colon 12/07/2019   Family history of diabetes mellitus 12/07/2019   Family history of hyperlipidemia 12/07/2019   Generalized headaches 12/07/2019   GERD (gastroesophageal reflux disease)    Hyperlipemia    Neck and shoulder pain 12/07/2019   Shortness of breath 12/07/2019   Thyroid disease    Upper back pain 12/07/2019    Past Surgical History:  Procedure Laterality Date   ABDOMINAL HYSTERECTOMY     TUBAL LIGATION      Family History  Problem Relation Age of Onset   Cancer Mother        stomach cancer   Cancer Brother        stomach cancer   Colon cancer Neg Hx    Colon polyps Neg Hx     Social History   Socioeconomic History   Marital status: Divorced    Spouse name: Not on file   Number of  children: 6   Years of education: Not on file   Highest education level: 1st grade  Occupational History   Not on file  Tobacco Use   Smoking status: Never   Smokeless tobacco: Never  Vaping Use   Vaping Use: Never used  Substance and Sexual Activity   Alcohol use: Yes    Comment: socially   Drug use: Never   Sexual activity: Yes    Birth control/protection: Condom, Surgical  Other Topics Concern   Not on file  Social History Narrative   Moved from [place] at 78 years old   41 children   Lives with daughter -Stanton Kidney and finace' and her two children       Enjoys working around the house      Diet: eats all food groups   Caffeine: coffee 2-3 cups   Water: 4-6 cups daily      Wears seat belt    Does not drive, but can.   Smoke detectors at home           Social Determinants of Health   Financial Resource Strain: Medium Risk   Difficulty of Paying Living Expenses: Somewhat hard  Food Insecurity: Landscape architect Present   Worried About Charity fundraiser in the Last Year: Sometimes true   Arboriculturist in the Last Year: Sometimes true  Transportation Needs: No Transportation Needs   Lack of Transportation (Medical): No   Lack of Transportation (Non-Medical): No  Physical Activity: Insufficiently Active   Days of Exercise per Week: 2 days   Minutes of Exercise per Session: 30 min  Stress: No Stress Concern Present   Feeling of Stress : Only a little  Social Connections: Socially Isolated   Frequency of Communication with Friends and Family: More than three times a week   Frequency of Social Gatherings with Friends and Family: Twice a week   Attends Religious Services: Never   Marine scientist or Organizations: No   Attends Music therapist: Never   Marital Status: Divorced  Human resources officer Violence: Not At Risk   Fear of Current or Ex-Partner: No   Emotionally Abused: No   Physically Abused: No   Sexually Abused: No    Outpatient  Medications Prior to Visit  Medication Sig Dispense Refill   albuterol (VENTOLIN HFA) 108 (90 Base) MCG/ACT inhaler Inhale 1 puff into the lungs every 6 (six) hours as needed for wheezing or shortness of breath. Inhale 1 puff into the lungs every 6 (six) hours as needed for wheezing or shortness of breath. 18 g 0   budesonide-formoterol (SYMBICORT) 160-4.5 MCG/ACT inhaler INHALE 2 PUFFS BY MOUTH ONCE DAILY 11 g 0   EPINEPHrine 0.3 mg/0.3 mL IJ SOAJ injection Inject 0.3 mg into the muscle as needed for anaphylaxis. 1 each 1   SUMAtriptan (IMITREX) 100 MG tablet TAKE ONE TABLET BY MOUTH AT EARLIEST ONSET OF HEADACHE. MAY REPEAT IN 2 HOURS IF HEADACHE PERSISTS OR RECURS. MAXIMUM 2 TABLETS IN 24 HOURS 10 tablet 5   tiotropium (SPIRIVA) 18 MCG inhalation capsule Place 1 capsule (18 mcg total) into inhaler and inhale daily. 30 capsule 0   busPIRone (BUSPAR) 5 MG tablet Take 1 tablet (5 mg total) by mouth 2 (two) times daily. (Patient not taking: Reported on 06/06/2021) 60 tablet 3   fesoterodine (TOVIAZ) 8 MG TB24 tablet Take 1 tablet (8 mg total) by mouth daily. (Patient not taking: Reported on 06/06/2021) 30 tablet 11   hyoscyamine (LEVSIN) 0.125 MG tablet Take 1 tablet (0.125 mg total) by mouth every 6 (six) hours as needed for bladder spasms. (Patient not taking: Reported on 06/06/2021) 30 tablet 0   levothyroxine (SYNTHROID) 75 MCG tablet Take 1 tablet (75 mcg total) by mouth daily before breakfast. (Patient not taking: Reported on 06/06/2021) 30 tablet 0   mirtazapine (REMERON) 15 MG tablet Take 1 tablet (15 mg total) by mouth at bedtime. (Patient not taking: Reported on 06/06/2021) 30 tablet 0   omeprazole (PRILOSEC) 20 MG capsule Take 1 capsule (20 mg total) by mouth 2 (two) times daily before a meal. (Patient not taking: Reported on 06/06/2021) 60 capsule 2   pantoprazole (PROTONIX) 40 MG tablet TAKE 1 TABLET BY MOUTH TWICE DAILY BEFORE A MEAL (Patient not taking: Reported on 06/06/2021) 90 tablet 0    propranolol (INDERAL) 20 MG tablet Take 1 tablet (20 mg total) by mouth 2 (two) times daily. (Patient not taking: Reported on 06/06/2021) 60 tablet 0   QUEtiapine (SEROQUEL) 100 MG tablet Take 1 tablet (100 mg total) by mouth at bedtime. (Patient not taking: No sig reported) 30 tablet 0   rosuvastatin (CRESTOR) 10 MG tablet Take  1 tablet (10 mg total) by mouth daily. (Patient not taking: Reported on 06/06/2021) 90 tablet 0   sertraline (ZOLOFT) 100 MG tablet Take 1 tablet (100 mg total) by mouth daily. (Patient not taking: Reported on 06/06/2021) 30 tablet 3   topiramate (TOPAMAX) 25 MG tablet Take 1 tablet at bedtime for one week, then increase to 2 tablets at bedtime. (Patient not taking: Reported on 06/06/2021) 60 tablet 0   Vitamin D, Ergocalciferol, (DRISDOL) 1.25 MG (50000 UNIT) CAPS capsule Take 1 capsule (50,000 Units total) by mouth every 7 (seven) days. (Patient not taking: Reported on 06/06/2021) 12 capsule 1   naproxen (NAPROSYN) 500 MG tablet Take 1 tablet (500 mg total) by mouth 2 (two) times daily with a meal. (Patient not taking: Reported on 06/06/2021) 60 tablet 1   No facility-administered medications prior to visit.    No Known Allergies  Review of Systems  Constitutional: Negative.   Respiratory: Negative.    Cardiovascular: Negative.   Neurological:  Positive for headaches.       Per HPI  Psychiatric/Behavioral: Negative.        Objective:    Physical Exam Constitutional:      Appearance: She is well-developed.  Cardiovascular:     Rate and Rhythm: Normal rate and regular rhythm.     Heart sounds: Normal heart sounds.  Pulmonary:     Effort: Pulmonary effort is normal.     Breath sounds: Normal breath sounds.  Neurological:     Mental Status: She is alert and oriented to person, place, and time.     GCS: GCS eye subscore is 4. GCS verbal subscore is 5. GCS motor subscore is 6.     Cranial Nerves: No cranial nerve deficit, dysarthria or facial asymmetry.     Motor:  No weakness.    BP 127/75 (BP Location: Left Arm, Patient Position: Sitting, Cuff Size: Large)   Pulse 72   Temp 98.2 F (36.8 C) (Oral)   Ht 5' 2"  (1.575 m)   Wt 131 lb (59.4 kg)   SpO2 97%   BMI 23.96 kg/m  Wt Readings from Last 3 Encounters:  06/06/21 131 lb (59.4 kg)  03/27/21 133 lb (60.3 kg)  02/14/21 130 lb (59 kg)    Health Maintenance Due  Topic Date Due   Pneumococcal Vaccine 53-75 Years old (1 - PCV) Never done   HIV Screening  Never done   Zoster Vaccines- Shingrix (1 of 2) Never done   COLONOSCOPY (Pts 45-43yr Insurance coverage will need to be confirmed)  Never done   MAMMOGRAM  Never done   INFLUENZA VACCINE  04/21/2021    There are no preventive care reminders to display for this patient.   Lab Results  Component Value Date   TSH 2.630 11/05/2020   Lab Results  Component Value Date   WBC 5.5 02/14/2021   HGB 12.5 02/14/2021   HCT 36.2 02/14/2021   MCV 83.6 02/14/2021   PLT 283 02/14/2021   Lab Results  Component Value Date   NA 136 02/14/2021   K 3.5 02/14/2021   CO2 26 02/14/2021   GLUCOSE 104 (H) 02/14/2021   BUN 7 02/14/2021   CREATININE 0.44 02/14/2021   BILITOT 0.7 02/14/2021   ALKPHOS 113 02/14/2021   AST 53 (H) 02/14/2021   ALT 43 02/14/2021   PROT 7.3 02/14/2021   ALBUMIN 4.2 02/14/2021   CALCIUM 8.7 (L) 02/14/2021   ANIONGAP 8 02/14/2021   Lab Results  Component Value  Date   CHOL 172 11/05/2020   Lab Results  Component Value Date   HDL 56 11/05/2020   Lab Results  Component Value Date   LDLCALC 98 11/05/2020   Lab Results  Component Value Date   TRIG 96 11/05/2020   Lab Results  Component Value Date   CHOLHDL 4.0 02/09/2020   Lab Results  Component Value Date   HGBA1C 5.2 12/07/2019       Assessment & Plan:   Problem List Items Addressed This Visit       Cardiovascular and Mediastinum   Migraine without aura and without status migrainosus, not intractable - Primary    -has been followed by neurology,  but next appt. Is in January -reviewed previous MRI, which was negative on 03/04/21 -Rx. Emgality, preferred on medicaid formulary -loading dose 240 mg ordered; 120 mg monthly thereafter -is taking imitrex, but is still having daily symptoms  -refilled topamax and propranolol -med check in 1 month      Relevant Medications   Galcanezumab-gnlm (EMGALITY) 120 MG/ML SOAJ   Galcanezumab-gnlm (EMGALITY) 120 MG/ML SOAJ (Start on 07/06/2021)     Endocrine   Hypothyroidism   Relevant Orders   TSH + free T4     Other   Mixed hyperlipidemia   Relevant Orders   CBC with Differential/Platelet   CMP14+EGFR   Lipid Panel With LDL/HDL Ratio     Meds ordered this encounter  Medications   Galcanezumab-gnlm (EMGALITY) 120 MG/ML SOAJ    Sig: Inject 240 mg into the skin now for 1 dose.    Dispense:  2 mL    Refill:  0   Galcanezumab-gnlm (EMGALITY) 120 MG/ML SOAJ    Sig: Inject 120 mg into the skin every 30 (thirty) days.    Dispense:  1.12 mL    Refill:  Barron, NP

## 2021-06-06 NOTE — Patient Instructions (Signed)
Please have fasting labs drawn 2-3 days prior to your appointment so we can discuss the results during your office visit.  

## 2021-07-10 ENCOUNTER — Ambulatory Visit (INDEPENDENT_AMBULATORY_CARE_PROVIDER_SITE_OTHER): Payer: Medicaid Other | Admitting: Internal Medicine

## 2021-07-10 ENCOUNTER — Encounter: Payer: Self-pay | Admitting: Internal Medicine

## 2021-07-10 ENCOUNTER — Other Ambulatory Visit: Payer: Self-pay

## 2021-07-10 ENCOUNTER — Telehealth: Payer: Self-pay | Admitting: *Deleted

## 2021-07-10 ENCOUNTER — Ambulatory Visit (INDEPENDENT_AMBULATORY_CARE_PROVIDER_SITE_OTHER): Payer: Medicaid Other | Admitting: *Deleted

## 2021-07-10 DIAGNOSIS — Z20822 Contact with and (suspected) exposure to covid-19: Secondary | ICD-10-CM | POA: Diagnosis not present

## 2021-07-10 LAB — POCT INFLUENZA A/B
Influenza A, POC: NEGATIVE
Influenza B, POC: NEGATIVE

## 2021-07-10 NOTE — Progress Notes (Signed)
Virtual Visit via Telephone Note   This visit type was conducted due to national recommendations for restrictions regarding the COVID-19 Pandemic (e.g. social distancing) in an effort to limit this patient's exposure and mitigate transmission in our community.  Due to her co-morbid illnesses, this patient is at least at moderate risk for complications without adequate follow up.  This format is felt to be most appropriate for this patient at this time.  The patient did not have access to video technology/had technical difficulties with video requiring transitioning to audio format only (telephone).  All issues noted in this document were discussed and addressed.  No physical exam could be performed with this format.  Evaluation Performed:  Follow-up visit  Date:  07/10/2021   ID:  Jennifer Wright, DOB 1966/02/20, MRN [id number]  Patient Location: Home Provider Location: Office/Clinic  Participants: Patient Location of Patient: Home Location of Provider: Telehealth Consent was obtain for visit to be over via telehealth. I verified that I am speaking with the correct person using two identifiers.  PCP:  Heather Roberts, NP   Chief Complaint: Fever, cough, myalgias, sore throat  History of Present Illness:    Jennifer Wright is a 55 y.o. female who has a televisit for complaint of cough, myalgias, sore throat and headache since yesterday.  She took DayQuil with some relief.  She denies any dyspnea or wheezing currently.  She has not had COVID test yet.  She denies any nausea, vomiting, abdominal pain or diarrhea.  The patient does have symptoms concerning for COVID-19 infection (fever, chills, cough, or new shortness of breath).   Past Medical, Surgical, Social History, Allergies, and Medications have been Reviewed.  Past Medical History:  Diagnosis Date   Anxiety    Asbestos exposure    Asthma    Depression    Encounter for screening for malignant neoplasm of colon 12/07/2019    Family history of diabetes mellitus 12/07/2019   Family history of hyperlipidemia 12/07/2019   Generalized headaches 12/07/2019   GERD (gastroesophageal reflux disease)    Hyperlipemia    Neck and shoulder pain 12/07/2019   Shortness of breath 12/07/2019   Thyroid disease    Upper back pain 12/07/2019   Past Surgical History:  Procedure Laterality Date   ABDOMINAL HYSTERECTOMY     TUBAL LIGATION       Current Meds  Medication Sig   albuterol (VENTOLIN HFA) 108 (90 Base) MCG/ACT inhaler Inhale 1 puff into the lungs every 6 (six) hours as needed for wheezing or shortness of breath. Inhale 1 puff into the lungs every 6 (six) hours as needed for wheezing or shortness of breath.   budesonide-formoterol (SYMBICORT) 160-4.5 MCG/ACT inhaler INHALE 2 PUFFS BY MOUTH ONCE DAILY   busPIRone (BUSPAR) 5 MG tablet Take 1 tablet (5 mg total) by mouth 2 (two) times daily.   EPINEPHrine 0.3 mg/0.3 mL IJ SOAJ injection Inject 0.3 mg into the muscle as needed for anaphylaxis.   fesoterodine (TOVIAZ) 8 MG TB24 tablet Take 1 tablet (8 mg total) by mouth daily.   Galcanezumab-gnlm (EMGALITY) 120 MG/ML SOAJ Inject 120 mg into the skin every 30 (thirty) days.   hyoscyamine (LEVSIN) 0.125 MG tablet Take 1 tablet (0.125 mg total) by mouth every 6 (six) hours as needed for bladder spasms.   levothyroxine (SYNTHROID) 75 MCG tablet Take 1 tablet (75 mcg total) by mouth daily before breakfast.   mirtazapine (REMERON) 15 MG tablet Take 1 tablet (15 mg total)  by mouth at bedtime.   omeprazole (PRILOSEC) 20 MG capsule Take 1 capsule (20 mg total) by mouth 2 (two) times daily before a meal.   pantoprazole (PROTONIX) 40 MG tablet TAKE 1 TABLET BY MOUTH TWICE DAILY BEFORE A MEAL   propranolol (INDERAL) 20 MG tablet Take 1 tablet (20 mg total) by mouth 2 (two) times daily.   QUEtiapine (SEROQUEL) 100 MG tablet Take 1 tablet (100 mg total) by mouth at bedtime.   rosuvastatin (CRESTOR) 10 MG tablet Take 1 tablet (10 mg total) by  mouth daily.   sertraline (ZOLOFT) 100 MG tablet Take 1 tablet (100 mg total) by mouth daily.   SUMAtriptan (IMITREX) 100 MG tablet TAKE ONE TABLET BY MOUTH AT EARLIEST ONSET OF HEADACHE. MAY REPEAT IN 2 HOURS IF HEADACHE PERSISTS OR RECURS. MAXIMUM 2 TABLETS IN 24 HOURS   tiotropium (SPIRIVA) 18 MCG inhalation capsule Place 1 capsule (18 mcg total) into inhaler and inhale daily.   topiramate (TOPAMAX) 25 MG tablet Take 1 tablet at bedtime for one week, then increase to 2 tablets at bedtime.   Vitamin D, Ergocalciferol, (DRISDOL) 1.25 MG (50000 UNIT) CAPS capsule Take 1 capsule (50,000 Units total) by mouth every 7 (seven) days.     Allergies:   Patient has no known allergies.   ROS:   Please see the history of present illness.     All other systems reviewed and are negative.   Labs/Other Tests and Data Reviewed:    Recent Labs: 11/05/2020: TSH 2.630 02/14/2021: ALT 43; BUN 7; Creatinine, Ser 0.44; Hemoglobin 12.5; Platelets 283; Potassium 3.5; Sodium 136   Recent Lipid Panel Lab Results  Component Value Date/Time   CHOL 172 11/05/2020 08:19 AM   TRIG 96 11/05/2020 08:19 AM   HDL 56 11/05/2020 08:19 AM   CHOLHDL 4.0 02/09/2020 09:36 AM   LDLCALC 98 11/05/2020 08:19 AM   LDLCALC 160 (H) 02/09/2020 09:36 AM    Wt Readings from Last 3 Encounters:  06/06/21 131 lb (59.4 kg)  03/27/21 133 lb (60.3 kg)  02/14/21 130 lb (59 kg)    ASSESSMENT & PLAN:    URTI Suspected COVID-19 infection Check COVID RT-PCR and rapid flu test Continue DayQuil or NyQuil as needed Advised to self quarantine for now Work note provided  Time:   Today, I have spent 9 minutes reviewing the chart, including problem list, medications, and with the patient with telehealth technology discussing the above problems.   Medication Adjustments/Labs and Tests Ordered: Current medicines are reviewed at length with the patient today.  Concerns regarding medicines are outlined above.   Tests Ordered: No  orders of the defined types were placed in this encounter.   Medication Changes: No orders of the defined types were placed in this encounter.    Note: This dictation was prepared with Dragon dictation along with smaller phrase technology. Similar sounding words can be transcribed inadequately or may not be corrected upon review. Any transcriptional errors that result from this process are unintentional.      Disposition:  Follow up  Signed, Anabel Halon, MD  07/10/2021 1:26 PM     [facility]

## 2021-07-10 NOTE — Telephone Encounter (Signed)
Attempted to call pt to let her know flu was negative let us know if she has any other symptoms while we wait on covid swab NA NVM

## 2021-07-12 LAB — SARS-COV-2, NAA 2 DAY TAT

## 2021-07-12 LAB — NOVEL CORONAVIRUS, NAA: SARS-CoV-2, NAA: NOT DETECTED

## 2021-07-14 ENCOUNTER — Telehealth: Payer: Self-pay | Admitting: Nurse Practitioner

## 2021-07-14 ENCOUNTER — Other Ambulatory Visit: Payer: Self-pay | Admitting: Internal Medicine

## 2021-07-14 DIAGNOSIS — U071 COVID-19: Secondary | ICD-10-CM

## 2021-07-14 MED ORDER — NIRMATRELVIR/RITONAVIR (PAXLOVID)TABLET: 3.0000 | ORAL_TABLET | Freq: Two times a day (BID) | ORAL | 0 refills | Status: AC

## 2021-07-14 NOTE — Telephone Encounter (Signed)
Called and notified patient of recommendations. She verbalized understanding. 

## 2021-07-14 NOTE — Telephone Encounter (Signed)
Pt took home test and is Covid positive, please call

## 2021-07-14 NOTE — Telephone Encounter (Signed)
Called and spoke with patient, she took an at home COVID test yesterday which came back positive. She had Telehealth visit and COVID test here 10/20. She took at home test because she had not heard about results from our office. Patient was made aware of results while on phone. Wanted to know if she needs to come back in for another COVID swab to verify at home test.

## 2021-07-21 ENCOUNTER — Telehealth: Payer: Self-pay

## 2021-07-21 NOTE — Telephone Encounter (Signed)
Patient called said she need note for work out of work for dates 07/10/2021-07/21/2021.  Patient asking does she need to have another covid test to make sure she does not still have covid.  Please contact patient back # [phone].

## 2021-07-22 NOTE — Telephone Encounter (Signed)
Letter up front patient aware.

## 2021-08-04 ENCOUNTER — Other Ambulatory Visit: Payer: Self-pay

## 2021-08-04 ENCOUNTER — Ambulatory Visit: Payer: Medicaid Other | Admitting: Nurse Practitioner

## 2021-08-04 ENCOUNTER — Ambulatory Visit (INDEPENDENT_AMBULATORY_CARE_PROVIDER_SITE_OTHER): Payer: Medicaid Other | Admitting: Nurse Practitioner

## 2021-08-04 ENCOUNTER — Other Ambulatory Visit: Payer: Self-pay | Admitting: *Deleted

## 2021-08-04 ENCOUNTER — Encounter: Payer: Self-pay | Admitting: Nurse Practitioner

## 2021-08-04 VITALS — BP 118/73 | HR 77 | Ht 62.0 in | Wt 135.0 lb

## 2021-08-04 DIAGNOSIS — E039 Hypothyroidism, unspecified: Secondary | ICD-10-CM

## 2021-08-04 DIAGNOSIS — M25531 Pain in right wrist: Secondary | ICD-10-CM

## 2021-08-04 DIAGNOSIS — F321 Major depressive disorder, single episode, moderate: Secondary | ICD-10-CM

## 2021-08-04 DIAGNOSIS — E782 Mixed hyperlipidemia: Secondary | ICD-10-CM

## 2021-08-04 DIAGNOSIS — G43009 Migraine without aura, not intractable, without status migrainosus: Secondary | ICD-10-CM

## 2021-08-04 DIAGNOSIS — Z1231 Encounter for screening mammogram for malignant neoplasm of breast: Secondary | ICD-10-CM

## 2021-08-04 DIAGNOSIS — E559 Vitamin D deficiency, unspecified: Secondary | ICD-10-CM

## 2021-08-04 MED ORDER — IBUPROFEN 600 MG PO TABS: 600.0000 mg | ORAL_TABLET | Freq: Every day | ORAL | 0 refills | Status: DC

## 2021-08-04 MED ORDER — SERTRALINE HCL 100 MG PO TABS: 100.0000 mg | ORAL_TABLET | Freq: Every day | ORAL | 3 refills | Status: DC

## 2021-08-04 MED ORDER — ROSUVASTATIN CALCIUM 10 MG PO TABS: 10.0000 mg | ORAL_TABLET | Freq: Every day | ORAL | 0 refills | Status: DC

## 2021-08-04 MED ORDER — PANTOPRAZOLE SODIUM 40 MG PO TBEC: 40.0000 mg | DELAYED_RELEASE_TABLET | Freq: Every day | ORAL | 0 refills | Status: DC

## 2021-08-04 MED ORDER — EMGALITY 120 MG/ML ~~LOC~~ SOAJ: 120.0000 mg | SUBCUTANEOUS | 11 refills | Status: DC

## 2021-08-04 MED ORDER — PROPRANOLOL HCL 20 MG PO TABS: 20.0000 mg | ORAL_TABLET | Freq: Two times a day (BID) | ORAL | 0 refills | Status: DC

## 2021-08-04 NOTE — Assessment & Plan Note (Signed)
Not able to get Emgality d/t to insurance.  Takes sumatriptan PRN.  Propanolol 20mg  BID refilled. PT educated on the need to take med daily , she verbalized understanding.  Follow up with neurology in January.

## 2021-08-04 NOTE — Progress Notes (Signed)
   Jennifer Wright     MRN: [id number]      DOB: 06/01/1966   HPI [name] accompanied  by an [employer] interpreter (Lilly Sobalvarp) was here for follow up and re-evaluation of chronic medical conditions, medication management and review of any available recent lab  Preventive health is updated, specifically  Cancer screening(mammogram) and Immunization discussed.  Questions or concerns regarding consultations last visit.   Pt had stopped taking all her meds except for sumatriptan.   PT continues to have migraine headaches 3 times a month, she only takes sumatriptan, stated that sumatriptan helps her migraine.   Ptc/o chronic right wrist pain (7/10)  from an injury sustained while at work, she does not take any medicine for this.  she has an appointment with otho on 11/24,   Depression. Pt stated that she is currently depressed because of her work situation ,not able to work much because of her right wrist injury. She stopped taking her zoloft 3 months ago.  Pt stated that she will return to the office tomorrow to get her labs done.    ROS Denies recent fever or chills. Denies sinus pressure, nasal congestion, ear pain or sore throat. Denies chest congestion, productive cough or wheezing. Denies chest pains, palpitations and leg swelling Denies abdominal pain, nausea, vomiting,diarrhea or constipation.   Denies dysuria, frequency, hesitancy or incontinence. Report Right hand pain  limitation in mobility or right wrist.  Report chronic headaches, denies seizures, numbness, or tingling. Reports  depression.     PE  BP 118/73 (BP Location: Left Arm, Patient Position: Sitting, Cuff Size: Normal)   Pulse 77   Ht 5' 2" (1.575 m)   Wt 135 lb (61.2 kg)   SpO2 98%   BMI 24.69 kg/m   Patient alert and oriented and in no cardiopulmonary distress.  HEENT: No facial asymmetry, EOMI,      Chest: Clear to auscultation bilaterally.  CVS: S1, S2 no murmurs, no S3.Regular  rate.  ABD: Soft non tender.   Ext: No edema  MS: Limited ROM of right wrist, has a brace in place. Tender on palpation.   Skin: Intact, no ulcerations or rash noted.  Psych: Good eye contact, normal affect. Memory intact not depressed appearing.    Assessment & Plan  

## 2021-08-04 NOTE — Assessment & Plan Note (Signed)
Pt stopped taking her synthroid . Will recheck TSH and reordered med if needed.

## 2021-08-04 NOTE — Assessment & Plan Note (Signed)
Pt missed her last appointment. Mammogram rescheduled.

## 2021-08-04 NOTE — Assessment & Plan Note (Signed)
She has an appointment coming up  with otho on NOV 24.  Continue to use hand brace.  Ibuprofen 600mg  daily ordered. Patient told to take protonix when taking ibuprofen.

## 2021-08-04 NOTE — Assessment & Plan Note (Signed)
Pt has stopped taking her medications.  Zoloft 100mg  refilled today.  Will refer to psychology if needed and add additional medication if symptoms persist.

## 2021-08-04 NOTE — Patient Instructions (Signed)
Please have your labs done tomorrow. Restart your propanolol take it every day to help prevent migraine. Take sumatriptan as needed for migraine. Take ibuprofen 600mg  once a day for your hand pain, take with protonix to help prevent stomach upset. Pls follow up with otho as discussed.  Reschedule her mammogram.  Get your shingles vaccine and COVID VAC at your pharmacy.  Thanks for choosing [facility] for your health care needs. We appreciate the opportunity to serve you.

## 2021-08-06 ENCOUNTER — Other Ambulatory Visit: Payer: Self-pay | Admitting: Nurse Practitioner

## 2021-08-06 DIAGNOSIS — E559 Vitamin D deficiency, unspecified: Secondary | ICD-10-CM

## 2021-08-06 LAB — VITAMIN D 25 HYDROXY (VIT D DEFICIENCY, FRACTURES): Vit D, 25-Hydroxy: 46.2 ng/mL (ref 30.0–100.0)

## 2021-08-06 MED ORDER — VITAMIN D3 25 MCG (1000 UT) PO CAPS: 1000.0000 [IU] | ORAL_CAPSULE | Freq: Every day | ORAL | 3 refills | Status: DC

## 2021-08-21 ENCOUNTER — Other Ambulatory Visit: Payer: Self-pay | Admitting: Neurology

## 2021-08-21 DIAGNOSIS — G43009 Migraine without aura, not intractable, without status migrainosus: Secondary | ICD-10-CM

## 2021-08-25 ENCOUNTER — Other Ambulatory Visit: Payer: Self-pay | Admitting: Nurse Practitioner

## 2021-08-25 ENCOUNTER — Telehealth: Payer: Self-pay | Admitting: *Deleted

## 2021-08-25 NOTE — Telephone Encounter (Signed)
-----   Message from Donell Beers, FNP sent at 08/25/2021  9:00 AM EST ----- Please tell pt that her emgality has been approved. She will inject 240mg (loading dose) the first month. After the first month she should inject 120mg  once a month for her migraine. She can continue immitrex as needed. I have discontinued propanolol due to her asthma. She should keep her appointment with neurology and  she should schedule and appointment to  see me in 4 weeks after she has started emgality to see how she is tolerating the med. Thanks

## 2021-08-25 NOTE — Telephone Encounter (Signed)
LM for patient to return call.

## 2021-09-02 NOTE — Telephone Encounter (Signed)
LM for patient to return call.

## 2021-09-04 NOTE — Telephone Encounter (Signed)
LM for pt to return call. 3rd attempt at reaching out to pt.

## 2021-10-05 NOTE — Progress Notes (Deleted)
Referring Provider: Perlie Mayo, NP Primary Care Physician:  Noreene Larsson, NP Primary GI Physician: Dr. Abbey Chatters  No chief complaint on file.   HPI:   Jennifer Wright is a 56 y.o. female presenting today for follow-up ***.   Last seen in our office at the time of initial consult in August 2021.  She was seen for GERD, RUQ abdominal pain.  She reported intermittent postprandial epigastric/RUQ pain x2-3 years with inability to eat during these times.  Associated diarrhea and vomiting.  She was taking Prilosec twice daily but did not feel like this was helping much.  Noted few pound weight loss.  Denies melena or hematochezia.  Previously with constipation and uses MiraLAX as needed.  Also reviewed recent labs and noticed elevated AST and ALT.  Prior ultrasound in September 2020 with fatty liver.  Plan to update ultrasound abdomen, consider HIDA.  If unrevealing, would recommend EGD.  Prilosec was stopped and she was started on Protonix twice daily.  Would need first-ever screening colonoscopy in the future.  Abdominal ultrasound 05/15/2020 with no evidence of gallstones or biliary abnormality.  She did have fatty liver. HIDA 06/13/2020 was normal exam. Additional labs completed to evaluate elevated LFTs on 06/26/2020:  Hepatitis A antibody negative, all hepatitis B labs negative, hepatitis A antibody total positive, iron panel within normal limits.  Repeat HFP with AST 47 (H), ALT 32, alk phos 112, T bili 0.3. (Down from AST 90 and ALT 97)  Also recommended arranging EGD and colonoscopy, but we were unable to reach her.   Today:     Reviewed labs.  LFTs normalized in February 2022.  Slight elevation of AST at 53 in May 2022.  Lipase mildly elevated at 55 in May.  CBC entirely normal.  Consider celiac testing.   Past Medical History:  Diagnosis Date   Anxiety    Asbestos exposure    Asthma    Depression    Encounter for screening for malignant neoplasm of colon 12/07/2019    Family history of diabetes mellitus 12/07/2019   Family history of hyperlipidemia 12/07/2019   Generalized headaches 12/07/2019   GERD (gastroesophageal reflux disease)    Hyperlipemia    Neck and shoulder pain 12/07/2019   Shortness of breath 12/07/2019   Thyroid disease    Upper back pain 12/07/2019    Past Surgical History:  Procedure Laterality Date   ABDOMINAL HYSTERECTOMY     TUBAL LIGATION      Current Outpatient Medications  Medication Sig Dispense Refill   albuterol (VENTOLIN HFA) 108 (90 Base) MCG/ACT inhaler Inhale 1 puff into the lungs every 6 (six) hours as needed for wheezing or shortness of breath. Inhale 1 puff into the lungs every 6 (six) hours as needed for wheezing or shortness of breath. 18 g 0   budesonide-formoterol (SYMBICORT) 160-4.5 MCG/ACT inhaler INHALE 2 PUFFS BY MOUTH ONCE DAILY 11 g 0   busPIRone (BUSPAR) 5 MG tablet Take 1 tablet (5 mg total) by mouth 2 (two) times daily. (Patient not taking: Reported on 08/04/2021) 60 tablet 3   Cholecalciferol (VITAMIN D3) 25 MCG (1000 UT) CAPS Take 1 capsule (1,000 Units total) by mouth daily. 60 capsule 3   EPINEPHrine 0.3 mg/0.3 mL IJ SOAJ injection Inject 0.3 mg into the muscle as needed for anaphylaxis. 1 each 1   fesoterodine (TOVIAZ) 8 MG TB24 tablet Take 1 tablet (8 mg total) by mouth daily. (Patient not taking: Reported on 08/04/2021) 30 tablet 11  Galcanezumab-gnlm (EMGALITY) 120 MG/ML SOAJ Inject 120 mg into the skin every 30 (thirty) days. 1.12 mL 11   hyoscyamine (LEVSIN) 0.125 MG tablet Take 1 tablet (0.125 mg total) by mouth every 6 (six) hours as needed for bladder spasms. (Patient not taking: Reported on 08/04/2021) 30 tablet 0   ibuprofen (ADVIL) 600 MG tablet Take 1 tablet (600 mg total) by mouth daily. 30 tablet 0   levothyroxine (SYNTHROID) 75 MCG tablet Take 1 tablet (75 mcg total) by mouth daily before breakfast. (Patient not taking: Reported on 08/04/2021) 30 tablet 0   mirtazapine (REMERON) 15 MG  tablet Take 1 tablet (15 mg total) by mouth at bedtime. (Patient not taking: Reported on 08/04/2021) 30 tablet 0   pantoprazole (PROTONIX) 40 MG tablet Take 1 tablet (40 mg total) by mouth daily. 90 tablet 0   QUEtiapine (SEROQUEL) 100 MG tablet Take 1 tablet (100 mg total) by mouth at bedtime. (Patient not taking: Reported on 08/04/2021) 30 tablet 0   rosuvastatin (CRESTOR) 10 MG tablet Take 1 tablet (10 mg total) by mouth daily. 90 tablet 0   sertraline (ZOLOFT) 100 MG tablet Take 1 tablet (100 mg total) by mouth daily. 30 tablet 3   SUMAtriptan (IMITREX) 100 MG tablet TAKE ONE TABLET BY MOUTH AT EARLIEST ONSET OF HEADACHE. MAY REPEAT IN 2 HOURS IF HEADACHE PERSISTS OR RECURS. MAXIMUM 2 TABLETS IN 24 HOURS 10 tablet 0   tiotropium (SPIRIVA) 18 MCG inhalation capsule Place 1 capsule (18 mcg total) into inhaler and inhale daily. 30 capsule 0   topiramate (TOPAMAX) 25 MG tablet Take 1 tablet at bedtime for one week, then increase to 2 tablets at bedtime. (Patient not taking: Reported on 08/04/2021) 60 tablet 0   No current facility-administered medications for this visit.    Allergies as of 10/06/2021   (No Known Allergies)    Family History  Problem Relation Age of Onset   Cancer Mother        stomach cancer   Cancer Brother        stomach cancer   Colon cancer Neg Hx    Colon polyps Neg Hx     Social History   Socioeconomic History   Marital status: Divorced    Spouse name: Not on file   Number of children: 6   Years of education: Not on file   Highest education level: 1st grade  Occupational History   Not on file  Tobacco Use   Smoking status: Never   Smokeless tobacco: Never  Vaping Use   Vaping Use: Never used  Substance and Sexual Activity   Alcohol use: Yes    Comment: socially   Drug use: Never   Sexual activity: Yes    Birth control/protection: Condom, Surgical  Other Topics Concern   Not on file  Social History Narrative   Moved from [place] at 61 years  old   26 children   Lives with daughter -Stanton Kidney and finace' and her two children       Enjoys working around the house      Diet: eats all food groups   Caffeine: coffee 2-3 cups   Water: 4-6 cups daily      Wears seat belt    Does not drive, but can.   Smoke detectors at home           Social Determinants of Health   Financial Resource Strain: Medium Risk   Difficulty of Paying Living Expenses: Somewhat hard  Food Insecurity:  Food Insecurity Present   Worried About Charity fundraiser in the Last Year: Sometimes true   Ran Out of Food in the Last Year: Sometimes true  Transportation Needs: No Transportation Needs   Lack of Transportation (Medical): No   Lack of Transportation (Non-Medical): No  Physical Activity: Insufficiently Active   Days of Exercise per Week: 2 days   Minutes of Exercise per Session: 30 min  Stress: No Stress Concern Present   Feeling of Stress : Only a little  Social Connections: Socially Isolated   Frequency of Communication with Friends and Family: More than three times a week   Frequency of Social Gatherings with Friends and Family: Twice a week   Attends Religious Services: Never   Marine scientist or Organizations: No   Attends Music therapist: Never   Marital Status: Divorced    Review of Systems: Gen: Denies fever, chills, anorexia. Denies fatigue, weakness, weight loss.  CV: Denies chest pain, palpitations, syncope, peripheral edema, and claudication. Resp: Denies dyspnea at rest, cough, wheezing, coughing up blood, and pleurisy. GI: Denies vomiting blood, jaundice, and fecal incontinence.   Denies dysphagia or odynophagia. Derm: Denies rash, itching, dry skin Psych: Denies depression, anxiety, memory loss, confusion. No homicidal or suicidal ideation.  Heme: Denies bruising, bleeding, and enlarged lymph nodes.  Physical Exam: There were no vitals taken for this visit. General:   Alert and oriented. No distress noted.  Pleasant and cooperative.  Head:  Normocephalic and atraumatic. Eyes:  Conjuctiva clear without scleral icterus. Mouth:  Oral mucosa pink and moist. Good dentition. No lesions. Heart:  S1, S2 present without murmurs appreciated. Lungs:  Clear to auscultation bilaterally. No wheezes, rales, or rhonchi. No distress.  Abdomen:  +BS, soft, non-tender and non-distended. No rebound or guarding. No HSM or masses noted. Msk:  Symmetrical without gross deformities. Normal posture. Extremities:  Without edema. Neurologic:  Alert and  oriented x4 Psych:  Alert and cooperative. Normal mood and affect.

## 2021-10-06 ENCOUNTER — Encounter: Payer: Self-pay | Admitting: Internal Medicine

## 2021-10-06 ENCOUNTER — Ambulatory Visit: Payer: Medicaid Other | Admitting: Gastroenterology

## 2021-10-07 ENCOUNTER — Ambulatory Visit: Payer: Medicaid Other | Admitting: Physician Assistant

## 2021-10-07 ENCOUNTER — Ambulatory Visit: Payer: Medicaid Other | Admitting: Neurology
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
## 2021-10-28 ENCOUNTER — Other Ambulatory Visit: Payer: Self-pay | Admitting: Neurology

## 2021-10-28 DIAGNOSIS — G43009 Migraine without aura, not intractable, without status migrainosus: Secondary | ICD-10-CM

## 2021-10-31 ENCOUNTER — Telehealth: Payer: Self-pay

## 2021-10-31 NOTE — Telephone Encounter (Signed)
Called pt to let her know that she needs to reach out to her neurologist to have this prescription refilled no answer left vm to return call

## 2021-10-31 NOTE — Telephone Encounter (Signed)
Patient called need med refills.  SUMAtriptan (IMITREX) 100 MG tablet   [facility] [place]

## 2021-11-03 NOTE — Telephone Encounter (Signed)
Called pt no answer left vm 

## 2021-11-06 NOTE — Telephone Encounter (Signed)
Spoke with pt advised that she would need to contact neurologist pt verbalized understanding

## 2021-11-10 ENCOUNTER — Other Ambulatory Visit: Payer: Self-pay

## 2021-11-10 ENCOUNTER — Telehealth: Payer: Self-pay | Admitting: Neurology

## 2021-11-10 DIAGNOSIS — G43009 Migraine without aura, not intractable, without status migrainosus: Secondary | ICD-10-CM

## 2021-11-10 MED ORDER — SUMATRIPTAN SUCCINATE 100 MG PO TABS: ORAL_TABLET | ORAL | 0 refills | Status: DC

## 2021-11-10 NOTE — Telephone Encounter (Signed)
Patient made an app, 11/27/21. She needs a refill on her sumatriptan 100. Sent same pharmacy

## 2021-11-10 NOTE — Telephone Encounter (Signed)
Called patient and let her know in order to continue to receive refill she needs to attend appointment in March. Sumatriptan has been sent into the pharmacy
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
## 2021-11-27 ENCOUNTER — Telehealth (INDEPENDENT_AMBULATORY_CARE_PROVIDER_SITE_OTHER): Payer: Medicaid Other | Admitting: Neurology

## 2021-11-27 ENCOUNTER — Telehealth: Payer: Self-pay

## 2021-11-27 ENCOUNTER — Encounter: Payer: Self-pay | Admitting: Neurology

## 2021-11-27 ENCOUNTER — Other Ambulatory Visit: Payer: Self-pay

## 2021-11-27 DIAGNOSIS — G43009 Migraine without aura, not intractable, without status migrainosus: Secondary | ICD-10-CM | POA: Diagnosis not present

## 2021-11-27 MED ORDER — SUMATRIPTAN SUCCINATE 100 MG PO TABS: ORAL_TABLET | ORAL | 0 refills | Status: DC

## 2021-11-27 MED ORDER — AIMOVIG 140 MG/ML ~~LOC~~ SOAJ: 140.0000 mg | SUBCUTANEOUS | 11 refills | Status: DC

## 2021-11-27 NOTE — Telephone Encounter (Signed)
F/u  Ellender Hose (Key: [place]) Rx #: [id number] Aimovig 140MG /ML auto-injectors   Form [employer] Electronic Prior Authorization Form (2017 NCPDP) Created 3 hours ago Sent to Plan 20 minutes ago Plan Response 20 minutes ago Submit Clinical Questions 10 minutes ago Determination Favorable 8 minutes ago Message from Plan Request Reference Number: [id number]. AIMOVIG INJ 140MG /ML is approved through 11/28/2022. For further questions, call [employer] at [phone].

## 2021-11-27 NOTE — Telephone Encounter (Signed)
New message   Your information has been sent to [employer].  Ellender Hose (Key: [place]) Rx #: [id number] Aimovig 140MG /ML auto-injectors   Form [employer] Electronic Prior Authorization Form (2017 NCPDP) Created 3 hours ago Sent to Plan 11 minutes ago Plan Response 10 minutes ago Submit Clinical Questions 1 minute ago Determination Wait for Determination Please wait for [employer] 2017 NCPDP to return a determination.

## 2021-11-27 NOTE — Patient Instructions (Addendum)
Start Aimovig every 28 days ?Sumatriptan as needed ?Limit use of pain relievers to no more than 2 days out of week to prevent risk of rebound or medication-overuse headache. ?Keep headache diary ?Follow up 4 months. ?

## 2021-12-26 ENCOUNTER — Other Ambulatory Visit: Payer: Self-pay | Admitting: Neurology

## 2021-12-26 DIAGNOSIS — G43009 Migraine without aura, not intractable, without status migrainosus: Secondary | ICD-10-CM

## 2022-02-02 ENCOUNTER — Ambulatory Visit: Payer: Medicaid Other | Admitting: Nurse Practitioner

## 2022-02-03 ENCOUNTER — Encounter: Payer: Self-pay | Admitting: Nurse Practitioner

## 2022-02-03 ENCOUNTER — Ambulatory Visit (INDEPENDENT_AMBULATORY_CARE_PROVIDER_SITE_OTHER): Payer: Medicaid Other | Admitting: Nurse Practitioner

## 2022-02-03 VITALS — BP 114/77 | HR 76 | Ht 62.0 in | Wt 135.0 lb

## 2022-02-03 DIAGNOSIS — E782 Mixed hyperlipidemia: Secondary | ICD-10-CM | POA: Diagnosis not present

## 2022-02-03 DIAGNOSIS — R29898 Other symptoms and signs involving the musculoskeletal system: Secondary | ICD-10-CM | POA: Diagnosis not present

## 2022-02-03 DIAGNOSIS — G43009 Migraine without aura, not intractable, without status migrainosus: Secondary | ICD-10-CM

## 2022-02-03 DIAGNOSIS — Z1231 Encounter for screening mammogram for malignant neoplasm of breast: Secondary | ICD-10-CM

## 2022-02-03 DIAGNOSIS — Z1211 Encounter for screening for malignant neoplasm of colon: Secondary | ICD-10-CM

## 2022-02-03 DIAGNOSIS — K219 Gastro-esophageal reflux disease without esophagitis: Secondary | ICD-10-CM

## 2022-02-03 DIAGNOSIS — E039 Hypothyroidism, unspecified: Secondary | ICD-10-CM | POA: Diagnosis not present

## 2022-02-03 DIAGNOSIS — F321 Major depressive disorder, single episode, moderate: Secondary | ICD-10-CM

## 2022-02-03 DIAGNOSIS — E559 Vitamin D deficiency, unspecified: Secondary | ICD-10-CM | POA: Diagnosis not present

## 2022-02-03 MED ORDER — PANTOPRAZOLE SODIUM 40 MG PO TBEC: 40.0000 mg | DELAYED_RELEASE_TABLET | Freq: Every day | ORAL | 0 refills | Status: DC

## 2022-02-03 NOTE — Assessment & Plan Note (Signed)
Chronic condition well-controlled currently on aimovig 140 mg injection every 28 days, sumatriptan 100 mg as needed ?Followed by neurology patient encouraged to maintain close follow-up with neurology she verbalized understanding ?

## 2022-02-03 NOTE — Assessment & Plan Note (Signed)
Complains of weakness has history of vitamin D deficiency currently not on medication we will check vitamin D levels ?Follow-up in 1 week to discuss labs ?

## 2022-02-03 NOTE — Assessment & Plan Note (Signed)
She had stopped taking her Crestor 10 mg daily ?Fasting lipid panel ordered ? ?

## 2022-02-03 NOTE — Progress Notes (Signed)
? ?  [name]     MRN: [id number]      DOB: [birth year] ? ? ?HPI ?[name] with past medical history of migraine, hypothyroidism, vitamin D deficiency, GERD, hyperlipidemia, weakness of both lower extremities is here for follow up and re-evaluation of chronic medical conditions. ? ?Pt c/o of bilateral leg soreness and weakness, numbness on her right heel, since 2 weeks ago, states that she never had this symptom before,  never smoked. Denies trauma, states that her weakness is getting better.,  Weakness is worse in the morning when she wakes up, she does not work currently and has not been exercising.  Denies seizures, difficulty with speech,. migraine headache is well controlled ? ? ?Patient stated that she has stopped taking her medication because she got a call to stop taking her medications.  ? ?Due for colon cancer screening, breast cancer screening referral sent today ? ? ?ROS ?Denies recent fever or chills. ?Denies sinus pressure, nasal congestion, ear pain or sore throat. ?Denies chest congestion, productive cough or wheezing. ?Denies chest pains, palpitations and leg swelling ?Denies abdominal pain, nausea, vomiting,diarrhea or constipation.   ?Denies dysuria, frequency, hesitancy or incontinence. ?Denies joint pain, swelling and limitation in mobility. ?Denies headaches, seizures, numbness, or tingling. ?Denies depression, anxiety or insomnia. ? ? ? ?PE ? ?BP 114/77 (BP Location: Right Arm, Patient Position: Sitting, Cuff Size: Normal)   Pulse 76   Ht 5\' 2"  (1.575 m)   Wt 135 lb (61.2 kg)   SpO2 98%   BMI 24.69 kg/m?  ? ?Patient alert and oriented and in no cardiopulmonary distress. ? ?HEENT: No facial asymmetry, EOMI,     Neck supple . ? ?Chest: Clear to auscultation bilaterally. ? ?CVS: S1, S2 no murmurs, no S3.Regular rate. ? ?ABD: Soft non tender.  ? ?Ext: No edema ? ?MS: Adequate ROM spine, shoulders, hips and knees. ? ?Psych: Good eye contact, normal affect. Memory intact not anxious or  depressed appearing. ? ?CNS: power,  normal throughout.no focal deficits noted, has decreased sensation on her right heel ? ? ?Assessment & Plan ? ?Weakness of both lower extremities ?Reports that symptoms started 2 weeks ago states that her symptoms are getting better. ?Denies having symptoms in the past but review of notes shows that patient has had the symptoms in the past. ?Labs ordered ?Patient encouraged to exercise daily at least 150 minutes a week she verbalized understanding. ? ?Mixed hyperlipidemia ?She had stopped taking her Crestor 10 mg daily ?Fasting lipid panel ordered ? ? ?Hypothyroidism ?She has stopped taking her Synthroid 75 mcg tablet ?We will check thyroid function and restart medication once results is back ?Follow-up in 1 week to discuss labs ? ?Vitamin D deficiency ?Complains of weakness has history of vitamin D deficiency currently not on medication we will check vitamin D levels ?Follow-up in 1 week to discuss labs ? ?Depression, major, single episode, moderate (HCC) ?PHQ-9 score 0 ?She has stopped taking all her medication ?Depression was most likely due to uncontrolled migraine which patient had in the past ? ?Migraine without aura and without status migrainosus, not intractable ?Chronic condition well-controlled currently on aimovig 140 mg injection every 28 days, sumatriptan 100 mg as needed ?Followed by neurology patient encouraged to maintain close follow-up with neurology she verbalized understanding ? ?Gastroesophageal reflux disease ?Chronic condition well-controlled on pantoprazole 40 mg daily ?Medication refilled today  ?

## 2022-02-03 NOTE — Patient Instructions (Signed)
Please get your fasting labs done as discussed ?Please get your shingles vaccine and TDAP vaccine at your pharmacy. ? ? ?It is important that you exercise regularly at least 30 minutes 5 times a week.  ?Think about what you will eat, plan ahead. ?Choose " clean, green, fresh or frozen" over canned, processed or packaged foods which are more sugary, salty and fatty. ?70 to 75% of food eaten should be vegetables and fruit. ?Three meals at set times with snacks allowed between meals, but they must be fruit or vegetables. ?Aim to eat over a 12 hour period , example 7 am to 7 pm, and STOP after  your last meal of the day. ?Drink water,generally about 64 ounces per day, no other drink is as healthy. Fruit juice is best enjoyed in a healthy way, by EATING the fruit. ? ?Thanks for choosing [facility], we consider it a privelige to serve you.  ? ? ?

## 2022-02-03 NOTE — Assessment & Plan Note (Signed)
She has stopped taking her Synthroid 75 mcg tablet ?We will check thyroid function and restart medication once results is back ?Follow-up in 1 week to discuss labs ?

## 2022-02-03 NOTE — Assessment & Plan Note (Addendum)
Reports that symptoms started 2 weeks ago states that her symptoms are getting better. ?Denies having symptoms in the past but review of notes shows that patient has had the symptoms in the past. ?Labs ordered ?Patient encouraged to exercise daily at least 150 minutes a week she verbalized understanding. ? ?

## 2022-02-03 NOTE — Assessment & Plan Note (Signed)
PHQ-9 score 0 ?She has stopped taking all her medication ?Depression was most likely due to uncontrolled migraine which patient had in the past ?

## 2022-02-03 NOTE — Assessment & Plan Note (Signed)
Chronic condition well-controlled on pantoprazole 40 mg daily ?Medication refilled today ?

## 2022-02-07 LAB — TSH+FREE T4
Free T4: 1.03 ng/dL (ref 0.82–1.77)
TSH: 6.14 u[IU]/mL — ABNORMAL HIGH (ref 0.450–4.500)

## 2022-02-07 LAB — CBC
Hematocrit: 41 % (ref 34.0–46.6)
Hemoglobin: 13.8 g/dL (ref 11.1–15.9)
MCH: 28.1 pg (ref 26.6–33.0)
MCHC: 33.7 g/dL (ref 31.5–35.7)
MCV: 84 fL (ref 79–97)
Platelets: 315 10*3/uL (ref 150–450)
RBC: 4.91 x10E6/uL (ref 3.77–5.28)
RDW: 12.9 % (ref 11.7–15.4)
WBC: 5.9 10*3/uL (ref 3.4–10.8)

## 2022-02-07 LAB — VITAMIN D 25 HYDROXY (VIT D DEFICIENCY, FRACTURES): Vit D, 25-Hydroxy: 39.8 ng/mL (ref 30.0–100.0)

## 2022-02-07 LAB — LIPID PANEL
Chol/HDL Ratio: 4.2 ratio (ref 0.0–4.4)
Cholesterol, Total: 181 mg/dL (ref 100–199)
HDL: 43 mg/dL (ref >39)
LDL Chol Calc (NIH): 100 mg/dL — ABNORMAL HIGH (ref 0–99)
Triglycerides: 222 mg/dL — ABNORMAL HIGH (ref 0–149)
VLDL Cholesterol Cal: 38 mg/dL (ref 5–40)

## 2022-02-07 LAB — BASIC METABOLIC PANEL
BUN/Creatinine Ratio: 26 — ABNORMAL HIGH (ref 9–23)
BUN: 16 mg/dL (ref 6–24)
CO2: 24 mmol/L (ref 20–29)
Calcium: 9.6 mg/dL (ref 8.7–10.2)
Chloride: 102 mmol/L (ref 96–106)
Creatinine, Ser: 0.62 mg/dL (ref 0.57–1.00)
Glucose: 97 mg/dL (ref 70–99)
Potassium: 4.7 mmol/L (ref 3.5–5.2)
Sodium: 141 mmol/L (ref 134–144)
eGFR: 106 mL/min/{1.73_m2} (ref >59)

## 2022-02-07 NOTE — Progress Notes (Signed)
I will discuss results with pt at her upcoming appointment.

## 2022-02-10 ENCOUNTER — Ambulatory Visit (INDEPENDENT_AMBULATORY_CARE_PROVIDER_SITE_OTHER): Payer: Medicaid Other | Admitting: Nurse Practitioner

## 2022-02-10 ENCOUNTER — Encounter: Payer: Self-pay | Admitting: Nurse Practitioner

## 2022-02-10 VITALS — BP 128/86 | HR 70 | Ht 62.0 in | Wt 134.0 lb

## 2022-02-10 DIAGNOSIS — E039 Hypothyroidism, unspecified: Secondary | ICD-10-CM

## 2022-02-10 DIAGNOSIS — E782 Mixed hyperlipidemia: Secondary | ICD-10-CM

## 2022-02-10 DIAGNOSIS — G43009 Migraine without aura, not intractable, without status migrainosus: Secondary | ICD-10-CM

## 2022-02-10 NOTE — Assessment & Plan Note (Addendum)
Lab Results  Component Value Date   CHOL 181 02/06/2022   HDL 43 02/06/2022   LDLCALC 100 (H) 02/06/2022   TRIG 222 (H) 02/06/2022   CHOLHDL 4.2 02/06/2022  She has stopped taking atorvastatin for the past 6 months Avoid fatty fried foods Patient told to  take OTC fish oil for hypertriglycerides, plan discussed with patient she verbalized understanding.

## 2022-02-10 NOTE — Assessment & Plan Note (Addendum)
Lab Results  Component Value Date   TSH 6.140 (H) 02/06/2022  recent TSH slightly elevated Last T4 was 1.03 normal  Ok to not use synthroid at this time since she stopped taking sythroid over 6 months ago We will recheck labs in 6 months.

## 2022-02-10 NOTE — Patient Instructions (Signed)
Please get your shingles vaccine and TDAP vaccine at your pharmacy.    It is important that you exercise regularly at least 30 minutes 5 times a week.  Think about what you will eat, plan ahead. Choose " clean, green, fresh or frozen" over canned, processed or packaged foods which are more sugary, salty and fatty. 70 to 75% of food eaten should be vegetables and fruit. Three meals at set times with snacks allowed between meals, but they must be fruit or vegetables. Aim to eat over a 12 hour period , example 7 am to 7 pm, and STOP after  your last meal of the day. Drink water,generally about 64 ounces per day, no other drink is as healthy. Fruit juice is best enjoyed in a healthy way, by EATING the fruit.  Thanks for choosing [facility], we consider it a privelige to serve you.    

## 2022-02-10 NOTE — Progress Notes (Signed)
   Jennifer Wright     MRN: [id number]      DOB: [birth year]   HPI Jennifer Wright with past medical history of migraine, GERD, hypothyroidism, mixed hyperlipidemia is here to discuss lab results.  Patient is accompanied to today's visit by a [employer] interpreter.    Patient has stopped taking synrhroid and atorvastatin since the past 6 months, denies insomnia, depression, weight gain.  Migraines.  Patient states that she has been having headaches since the past 2 weeks ,currently on aimovig 140mg  every 28 days injection, Imitrex 100 mg as needed headache.  Followed by neurology.  Denies seizures.   Patient is due for Tdap vaccine, shingles vaccine.  Need for both vaccines discussed with patient patient encouraged to get vaccines at the pharmacy she verbalized understanding.    ROS Denies recent fever or chills. Denies sinus pressure, nasal congestion, ear pain or sore throat. Denies chest congestion, productive cough or wheezing. Denies chest pains, palpitations and leg swelling Denies abdominal pain, nausea, vomiting,diarrhea or constipation.   Denies dysuria, frequency, hesitancy or incontinence. Denies joint pain, swelling and limitation in mobility. Denies depression, anxiety or insomnia.    PE  BP 128/86 (BP Location: Right Arm, Patient Position: Sitting, Cuff Size: Normal)   Pulse 70   Ht 5\' 2"  (1.575 m)   Wt 134 lb (60.8 kg)   SpO2 96%   BMI 24.51 kg/m   Patient alert and oriented and in no cardiopulmonary distress.  HEENT: No facial asymmetry, EOMI,     Neck supple .  Chest: Clear to auscultation bilaterally.  CVS: S1, S2 no murmurs, no S3.Regular rate.  ABD: Soft non tender.   MS: Adequate ROM spine, shoulders, hips and knees.  Psych: Good eye contact, normal affect. Memory intact not anxious or depressed appearing.  CNS: CN 2-12 intact, power,  normal throughout.no focal deficits noted.   Assessment & Plan Hypothyroidism Lab Results  Component Value Date    TSH 6.140 (H) 02/06/2022  recent TSH slightly elevated Last T4 was 1.03 normal  Ok to not use synthroid at this time since she stopped taking sythroid over 6 months ago We will recheck labs in 6 months.     Migraine without aura and without status migrainosus, not intractable Chronic condition Continue aimovig 140mg  every 28 days injection, Imitrex 100 mg as needed headache.  Follow-up with neurology as needed  Mixed hyperlipidemia Lab Results  Component Value Date   CHOL 181 02/06/2022   HDL 43 02/06/2022   LDLCALC 100 (H) 02/06/2022   TRIG 222 (H) 02/06/2022   CHOLHDL 4.2 02/06/2022  She has stopped taking atorvastatin for the past 6 months Avoid fatty fried foods Patient may take OTC fish oil for hypertriglycerides, plan discussed with patient she verbalized understanding.

## 2022-02-10 NOTE — Assessment & Plan Note (Signed)
Chronic condition Continue aimovig 140mg  every 28 days injection, Imitrex 100 mg as needed headache.  Follow-up with neurology as needed

## 2022-02-17 ENCOUNTER — Encounter: Payer: Self-pay | Admitting: *Deleted

## 2022-02-17 IMAGING — MR MR HEAD W/O CM
12 series · 48 of 48 positions shown · non-contrast
Comparison: None.

CLINICAL DATA: Dizziness.  Headaches.

EXAM:
MRI HEAD WITHOUT CONTRAST
TECHNIQUE: Multiplanar, multiecho pulse sequences of the brain and surrounding
structures were obtained without intravenous contrast.

[Series 5: DWI · axial · 3.0mm · 0.77mm/px · z∈[-80,+66]mm · 3 of 50 slices shown (1 of 4)]
[im 1/50]
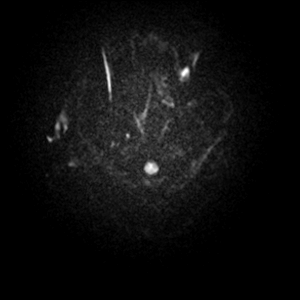
[im 25/50]
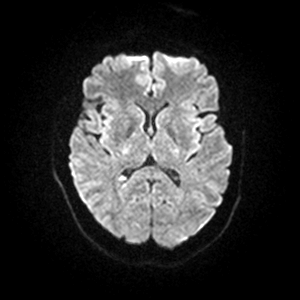
[im 50/50]
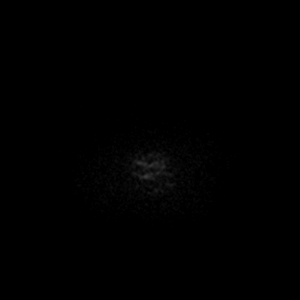

[Series 6: DWI · axial · 3.0mm · 0.77mm/px · z∈[-80,+66]mm · 3 of 50 slices shown (2 of 4)]
[im 1/50]
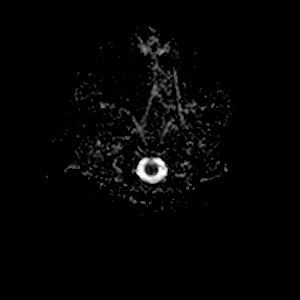
[im 25/50]
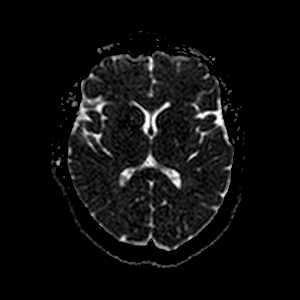
[im 50/50]
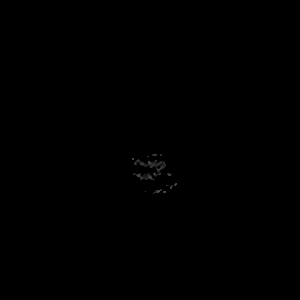

[Series 7: DWI · coronal · 5.0mm · 0.88mm/px · 2 of 28 slices shown (3 of 4)]
[im 1/28]
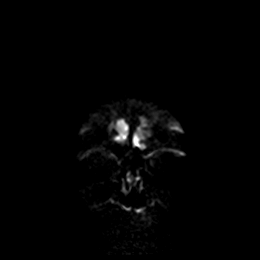
[im 28/28]
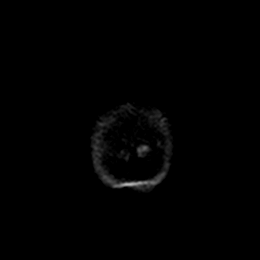

[Series 8: DWI · coronal · 5.0mm · 0.88mm/px · 2 of 28 slices shown (4 of 4)]
[im 1/28]
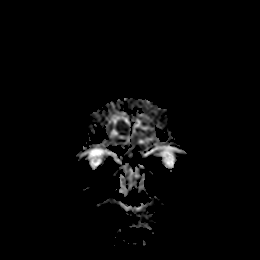
[im 28/28]
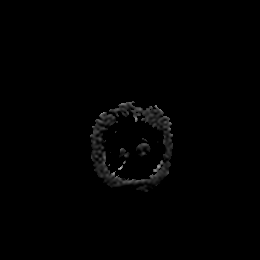

[Series 9: T1 · sagittal · 5.0mm · 0.75mm/px · 2 of 21 slices shown (1 of 2)]
[im 1/21]
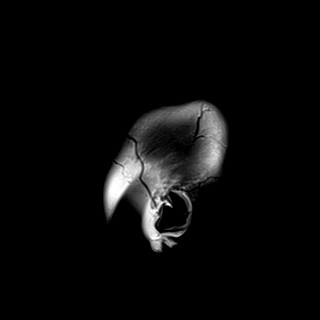
[im 21/21]
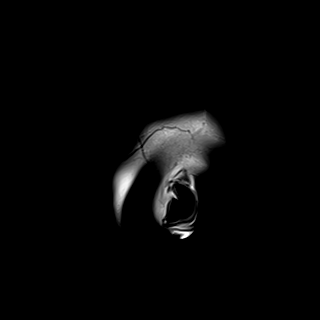

[Series 10: T2 · axial · 5.0mm · 0.72mm/px · z∈[-84,+70]mm · 2 of 23 slices shown (1 of 2)]
[im 1/23]
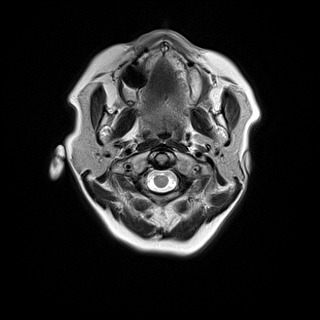
[im 23/23]
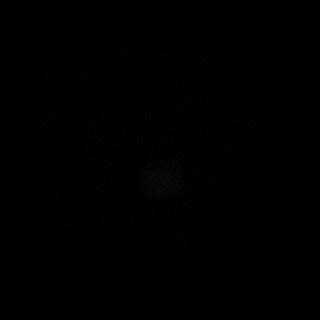

[Series 11: mag_images · axial · 3.0mm · 0.90mm/px · z∈[-95,+81]mm · 5 of 60 slices shown]
[im 1/60]
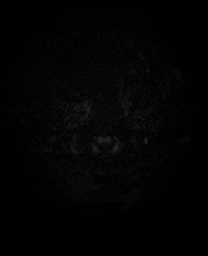
[im 15/60]
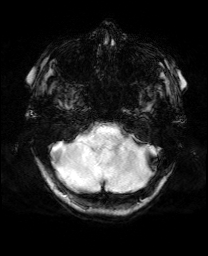
[im 30/60]
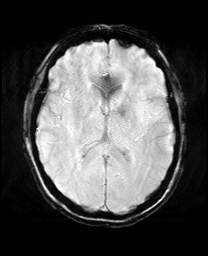
[im 45/60]
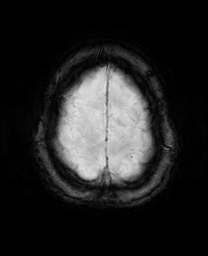
[im 60/60]
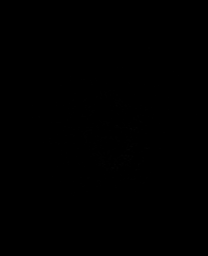

[Series 12: pha_images · axial · 3.0mm · 0.90mm/px · z∈[-95,+81]mm · 5 of 59 slices shown]
[im 1/59]
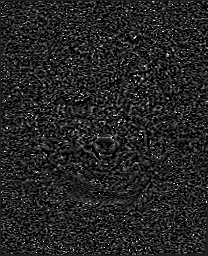
[im 15/59]
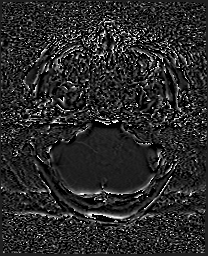
[im 30/59]
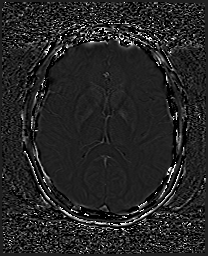
[im 44/59]
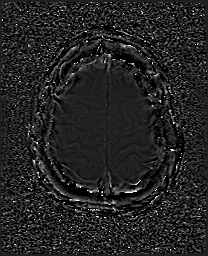
[im 59/59]
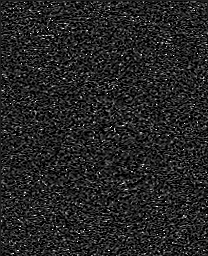

[Series 13: swi_images · axial · 3.0mm · 0.90mm/px · z∈[-95,+81]mm · 5 of 60 slices shown]
[im 1/60]
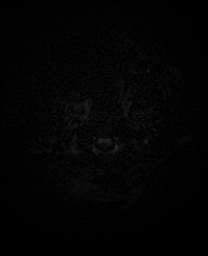
[im 15/60]
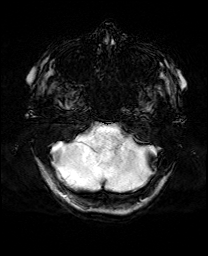
[im 30/60]
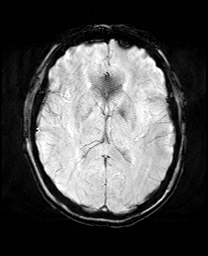
[im 45/60]
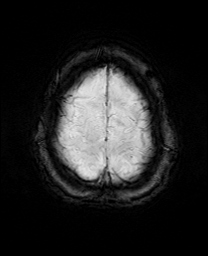
[im 60/60]
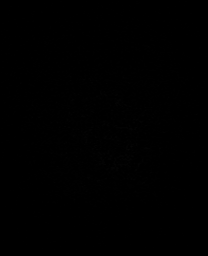

[Series 15: FLAIR · axial · 3.0mm · 0.45mm/px · z∈[-80,+67]mm · 4 of 50 slices shown]
[im 1/50]
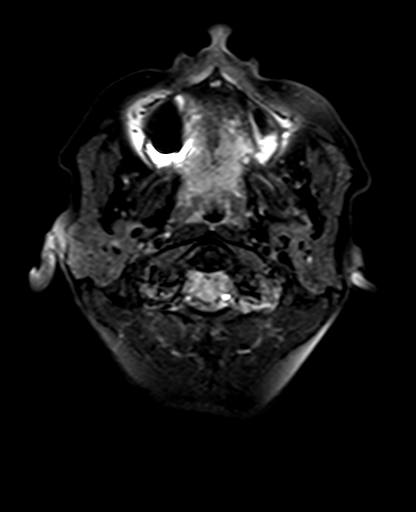
[im 17/50]
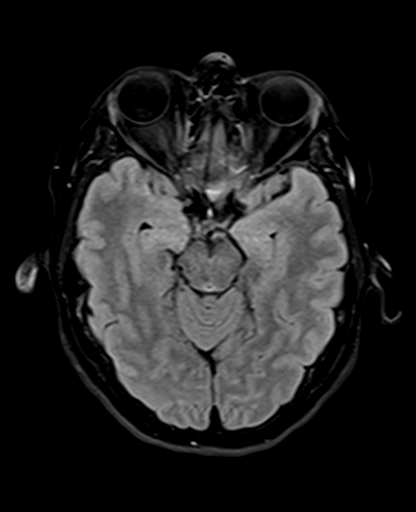
[im 33/50]
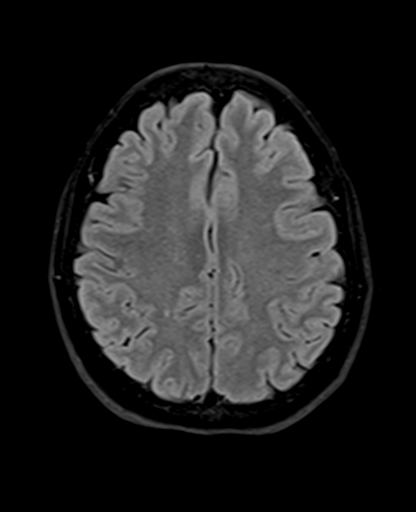
[im 50/50]
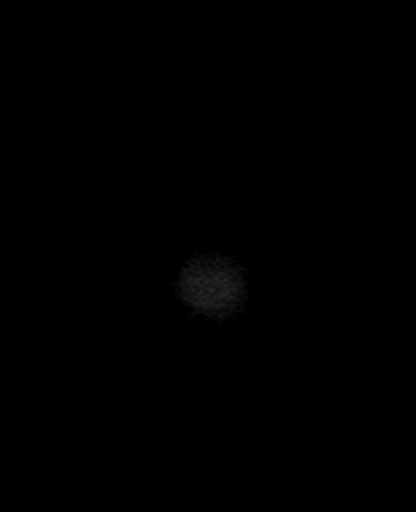

[Series 16: T1 · axial · 1.0mm · 0.98mm/px · z∈[-95,+78]mm · 13 of 170 slices shown (2 of 2)]
[im 1/170]
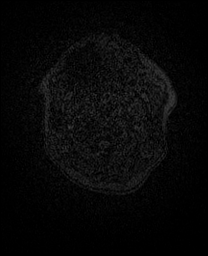
[im 15/170]
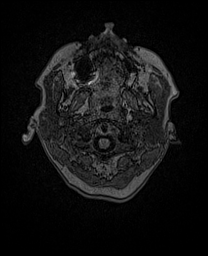
[im 29/170]
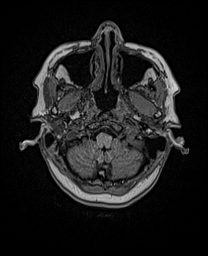
[im 43/170]
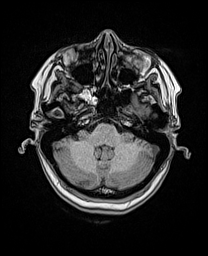
[im 57/170]
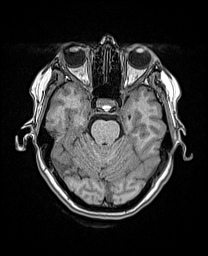
[im 71/170]
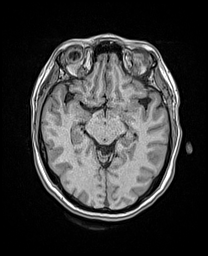
[im 85/170]
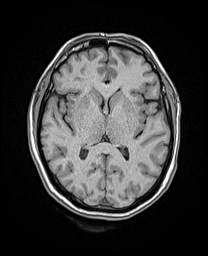
[im 99/170]
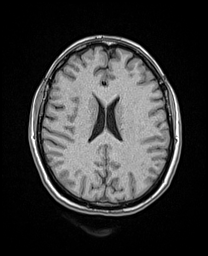
[im 113/170]
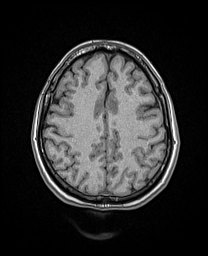
[im 127/170]
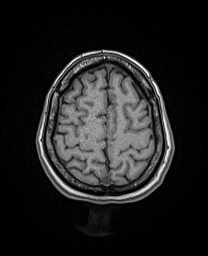
[im 141/170]
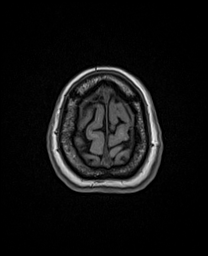
[im 155/170]
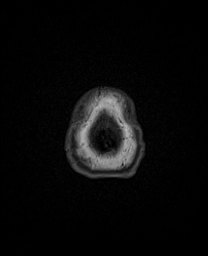
[im 170/170]
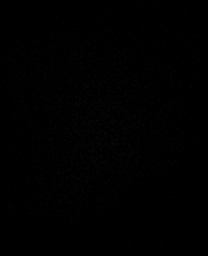

[Series 17: T2 · coronal · 5.0mm · 0.72mm/px · 2 of 28 slices shown (2 of 2)]
[im 1/28]
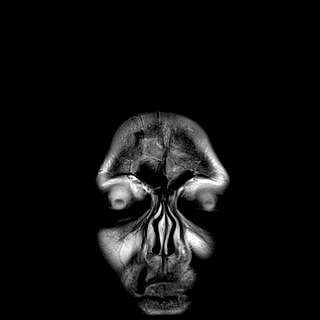
[im 28/28]
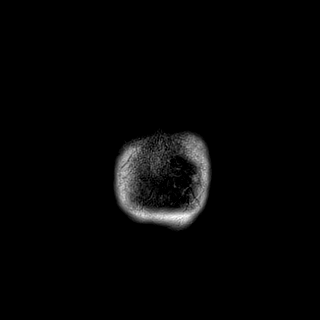

[48 of 48 positions shown; findings below may reference images not displayed]

FINDINGS: Brain: No acute infarction, hemorrhage, hydrocephalus, extra-axial
collection or intraparenchymal mass lesion. Incidental bilateral
choroid plexus xanthogranulomas. Mild T2/FLAIR hyperintensities
within the white matter, within normal limits in number for age.

Vascular: Major arterial flow voids are maintained skull base.

Skull and upper cervical spine: Normal marrow signal.

Sinuses/Orbits: Clear sinuses.  Unremarkable orbits.

Other: No mastoid effusions.
IMPRESSION: Normal appearance of the brain for age.  No acute abnormality.

## 2022-03-12 ENCOUNTER — Encounter: Payer: Self-pay | Admitting: Nurse Practitioner

## 2022-03-12 ENCOUNTER — Ambulatory Visit (INDEPENDENT_AMBULATORY_CARE_PROVIDER_SITE_OTHER): Payer: Medicaid Other | Admitting: Nurse Practitioner

## 2022-03-12 VITALS — BP 137/87 | HR 64 | Temp 97.6°F | Ht 62.0 in | Wt 136.0 lb

## 2022-03-12 DIAGNOSIS — J40 Bronchitis, not specified as acute or chronic: Secondary | ICD-10-CM | POA: Insufficient documentation

## 2022-03-12 DIAGNOSIS — J029 Acute pharyngitis, unspecified: Secondary | ICD-10-CM | POA: Diagnosis not present

## 2022-03-12 LAB — POCT RAPID STREP A (OFFICE): Rapid Strep A Screen: NEGATIVE

## 2022-03-12 MED ORDER — ALBUTEROL SULFATE HFA 108 (90 BASE) MCG/ACT IN AERS: 1.0000 | INHALATION_SPRAY | Freq: Four times a day (QID) | RESPIRATORY_TRACT | 0 refills | Status: DC | PRN

## 2022-03-12 MED ORDER — METHYLPREDNISOLONE 4 MG PO TBPK: ORAL_TABLET | ORAL | 0 refills | Status: DC

## 2022-03-12 MED ORDER — BENZONATATE 100 MG PO CAPS: 100.0000 mg | ORAL_CAPSULE | Freq: Two times a day (BID) | ORAL | 0 refills | Status: DC | PRN

## 2022-03-12 NOTE — Patient Instructions (Addendum)
Please take medrol dose pak as directed.  Take tessalon 100mg  twice daily as needed.  Use albuterol inhaler as needed for wheezing    It is important that you exercise regularly at least 30 minutes 5 times a week.  Think about what you will eat, plan ahead. Choose " clean, green, fresh or frozen" over canned, processed or packaged foods which are more sugary, salty and fatty. 70 to 75% of food eaten should be vegetables and fruit. Three meals at set times with snacks allowed between meals, but they must be fruit or vegetables. Aim to eat over a 12 hour period , example 7 am to 7 pm, and STOP after  your last meal of the day. Drink water,generally about 64 ounces per day, no other drink is as healthy. Fruit juice is best enjoyed in a healthy way, by EATING the fruit.  Thanks for choosing [facility], we consider it a privelige to serve you.

## 2022-03-12 NOTE — Assessment & Plan Note (Signed)
Strep test negative Sore throat probably from coughing Patient told to drink warm water and fluids

## 2022-03-12 NOTE — Progress Notes (Signed)
   ALONA DANFORD     MRN: [id number]      DOB: 08-04-1966   HPI [name] with past medical history of hyperlipidemia, migraine, hypothyroidism, is here for complaint of dry cough, hoarseness that started a week ago.  Patient stated that she has sore throat that started today, patient denies fever, bloody sputum ,chills, ear pain, sneezing, stuffy nose, shortness of breath,.  Patient is up-to-date with flu vaccine has had 2 COVID vaccines she denies known sick contacts.  Stated that she has some wheezing but no shortness of breath.  She did COVID test at home which was negative.  She has tried over-the-counter cough medication but none helped.    ROS Denies recent fever or chills. Denies sinus pressure, nasal congestion, ear pain or sore throat. Denies chest pains, palpitations and leg swelling Denies abdominal pain, nausea, vomiting,diarrhea or constipation.   Denies dysuria, frequency, hesitancy or incontinence. Denies joint pain, swelling and limitation in mobility. Denies headaches, seizures, numbness, or tingling. Denies depression, anxiety or insomnia.    PE  BP 137/87 (BP Location: Right Arm, Patient Position: Sitting, Cuff Size: Normal)   Pulse 64   Temp 97.6 F (36.4 C)   Ht 5\' 2"  (1.575 m)   Wt 136 lb (61.7 kg)   SpO2 97%   BMI 24.87 kg/m   Patient alert and oriented and in no cardiopulmonary distress.  HEENT: No facial asymmetry, EOMI,     Neck supple .  Oral mucosa moist and pink, pharynx appears normal without tonsillar swelling or exudate.  No adenopathy noted  Chest: Clear to auscultation bilaterally.  CVS: S1, S2 no murmurs, no S3.Regular rate.  ABD: Soft non tender.   Ext: No edema  MS: Adequate ROM spine, shoulders, hips and knees.  Psych: Good eye contact, normal affect. Memory intact not anxious or depressed appearing.  CNS: CN 2-12 intact, power,  normal throughout.no focal deficits noted.   Assessment & Plan Sore throat Strep test  negative Sore throat probably from coughing Patient told to drink warm water and fluids  Bronchitis Most likely of viral origin Start Tessalon 100 mg twice daily as needed Rx Medrol dose pack 4 mg tablets Will order inhaler 1 puff every 6 hours as needed for wheezing

## 2022-03-12 NOTE — Assessment & Plan Note (Addendum)
Most likely of viral origin Start Tessalon 100 mg twice daily as needed Rx Medrol dose pack 4 mg tablets Will order inhaler 1 puff every 6 hours as needed for wheezing

## 2022-04-08 ENCOUNTER — Other Ambulatory Visit: Payer: Self-pay | Admitting: Nurse Practitioner
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
## 2022-05-19 ENCOUNTER — Encounter: Payer: Self-pay | Admitting: Neurology

## 2022-05-19 ENCOUNTER — Ambulatory Visit: Payer: Self-pay | Admitting: Neurology

## 2022-05-19 DIAGNOSIS — Z029 Encounter for administrative examinations, unspecified: Secondary | ICD-10-CM

## 2022-05-26 ENCOUNTER — Other Ambulatory Visit (HOSPITAL_COMMUNITY): Payer: Self-pay

## 2022-05-28 ENCOUNTER — Other Ambulatory Visit (HOSPITAL_COMMUNITY): Payer: Self-pay

## 2022-06-04 ENCOUNTER — Ambulatory Visit: Payer: Medicaid Other | Admitting: Nurse Practitioner

## 2022-06-04 ENCOUNTER — Encounter: Payer: Self-pay | Admitting: Internal Medicine

## 2022-06-04 ENCOUNTER — Ambulatory Visit (INDEPENDENT_AMBULATORY_CARE_PROVIDER_SITE_OTHER): Payer: 59 | Admitting: Internal Medicine

## 2022-06-04 ENCOUNTER — Telehealth: Payer: Self-pay | Admitting: Internal Medicine

## 2022-06-04 VITALS — BP 108/60 | HR 68 | Ht 62.0 in | Wt 132.0 lb

## 2022-06-04 DIAGNOSIS — G43009 Migraine without aura, not intractable, without status migrainosus: Secondary | ICD-10-CM | POA: Diagnosis not present

## 2022-06-04 DIAGNOSIS — Z2821 Immunization not carried out because of patient refusal: Secondary | ICD-10-CM

## 2022-06-04 DIAGNOSIS — Z1212 Encounter for screening for malignant neoplasm of rectum: Secondary | ICD-10-CM

## 2022-06-04 DIAGNOSIS — E039 Hypothyroidism, unspecified: Secondary | ICD-10-CM | POA: Diagnosis not present

## 2022-06-04 DIAGNOSIS — E559 Vitamin D deficiency, unspecified: Secondary | ICD-10-CM | POA: Diagnosis not present

## 2022-06-04 DIAGNOSIS — Z0000 Encounter for general adult medical examination without abnormal findings: Secondary | ICD-10-CM | POA: Diagnosis not present

## 2022-06-04 DIAGNOSIS — Z1211 Encounter for screening for malignant neoplasm of colon: Secondary | ICD-10-CM | POA: Diagnosis not present

## 2022-06-04 DIAGNOSIS — R5383 Other fatigue: Secondary | ICD-10-CM | POA: Diagnosis not present

## 2022-06-04 DIAGNOSIS — E611 Iron deficiency: Secondary | ICD-10-CM | POA: Diagnosis not present

## 2022-06-04 DIAGNOSIS — Z23 Encounter for immunization: Secondary | ICD-10-CM | POA: Diagnosis not present

## 2022-06-04 DIAGNOSIS — M79604 Pain in right leg: Secondary | ICD-10-CM | POA: Diagnosis not present

## 2022-06-04 DIAGNOSIS — K219 Gastro-esophageal reflux disease without esophagitis: Secondary | ICD-10-CM | POA: Diagnosis not present

## 2022-06-04 DIAGNOSIS — M25561 Pain in right knee: Secondary | ICD-10-CM | POA: Diagnosis not present

## 2022-06-04 MED ORDER — MELOXICAM 7.5 MG PO TABS: 7.5000 mg | ORAL_TABLET | Freq: Every day | ORAL | 0 refills | Status: AC

## 2022-06-04 MED ORDER — SUMATRIPTAN SUCCINATE 100 MG PO TABS: ORAL_TABLET | ORAL | 0 refills | Status: DC

## 2022-06-04 MED ORDER — AIMOVIG 140 MG/ML ~~LOC~~ SOAJ: 140.0000 mg | SUBCUTANEOUS | 11 refills | Status: DC

## 2022-06-04 MED ORDER — PANTOPRAZOLE SODIUM 40 MG PO TBEC: 40.0000 mg | DELAYED_RELEASE_TABLET | Freq: Every day | ORAL | 0 refills | Status: AC

## 2022-06-04 NOTE — Assessment & Plan Note (Signed)
She reports recent right knee and heel pain.  Pain is located at the medial and posterior portions of the right knee.  There is tenderness palpation over these locations on exam, no palpable masses.  Possible anserine bursitis, patellar tendinitis, or a Baker's cyst.  Her heel pain is worse in the morning and improves with ambulation.  Potentially consistent with plantar fasciitis. -I have prescribed Mobic 7.5 mg x14 days and provided her with home PT exercises for her knee and heel. -Plan for follow-up in 4 weeks.  If there is no improvement in pain at that time, consider referral to sports medicine

## 2022-06-04 NOTE — Assessment & Plan Note (Signed)
Chronic condition.  Stable without change to pattern or intensity of migraines. -Aimovig and Imitrex refilled

## 2022-06-04 NOTE — Assessment & Plan Note (Signed)
She endorses fatigue for the last 4 days, noting that she just wants to lay in bed and does not feel like doing anything.  Noted history of hypothyroidism and iron deficiency.  She is not currently on levothyroxine and is not taking iron supplementation.  Denies associated symptoms of unintentional weight loss, fever/chills, night sweats, and melena/hematochezia. -Basic labs ordered today, including TSH/T4 and iron studies. -Follow-up in 4 weeks for reassessment

## 2022-06-04 NOTE — Assessment & Plan Note (Signed)
-  Basic labs ordered today, including one-time HCV screening -Influenza vaccine administered today -Shingrix vaccine administered today -Cologuard ordered

## 2022-06-04 NOTE — Progress Notes (Signed)
Established Patient Office Visit  Subjective   Patient ID: Jennifer Wright, female    DOB: January 10, 1966  Age: 56 y.o. MRN: [id number]  Chief Complaint  Patient presents with   Medication Refill   Dizziness    05/28/2022 started feeling weak, tired, and no energy    [name] returns to care today.  She is a 56 year old woman with past medical history of HLD, migraines, GERD and hypothyroidism.  Last seen by Vena Rua, NP on 03/12/2022 for upper respiratory symptoms.  Today she request medication refills.  She also endorses recent development of right knee pain.  Pains been present for the last month.  There was no inciting event or trauma at the onset of pain.  Pain is worsened with activity such as bending at the knee and walking downstairs.  She reports pain on the medial and posterior portions of the knee.  She states that pain occurs when attempting to extend at the knee after bending or sitting for extended periods of time.  She has not tried any medications for pain relief and continues to work.  On further questioning, she also endorses right heel pain that is worse in the morning.  She states that with ambulation her pain improves.  [name] also reports fatigue for the last 4 days.  She notes that she did not have any energy to do activities around her home and just wants to lay down.  She denies any associated symptoms such as changes in her weight, loss of hair, melena/hematochezia, or night sweats.  Acute concerns and chronic medical conditions discussed today are individually addressed in A/P below.  Past Medical History:  Diagnosis Date   Anxiety    Asbestos exposure    Asthma    Depression    Encounter for screening for malignant neoplasm of colon 12/07/2019   Family history of diabetes mellitus 12/07/2019   Family history of hyperlipidemia 12/07/2019   Generalized headaches 12/07/2019   GERD (gastroesophageal reflux disease)    Hyperlipemia    Neck and shoulder pain  12/07/2019   Shortness of breath 12/07/2019   Thyroid disease    Upper back pain 12/07/2019   Past Surgical History:  Procedure Laterality Date   ABDOMINAL HYSTERECTOMY     TUBAL LIGATION     Social History   Tobacco Use   Smoking status: Never   Smokeless tobacco: Never  Vaping Use   Vaping Use: Never used  Substance Use Topics   Alcohol use: Yes    Comment: socially   Drug use: Never   Family History  Problem Relation Age of Onset   Cancer Mother        stomach cancer   Cancer Brother        stomach cancer   Colon cancer Neg Hx    Colon polyps Neg Hx    No Known Allergies  Review of Systems  Constitutional:  Positive for malaise/fatigue.  Musculoskeletal:        Right knee pain  All other systems reviewed and are negative.    Objective:     BP 108/60   Pulse 68   Ht _0  (1.575 m)   Wt 132 lb (59.9 kg)   SpO2 99%   BMI 24.14 kg/m  BP Readings from Last 3 Encounters:  06/04/22 108/60  03/12/22 137/87  02/10/22 128/86   Physical Exam Vitals reviewed.  Constitutional:      General: She is not in acute distress.  Appearance: Normal appearance. She is not toxic-appearing.  HENT:     Head: Normocephalic and atraumatic.     Right Ear: External ear normal.     Left Ear: External ear normal.     Nose: Nose normal. No congestion or rhinorrhea.     Mouth/Throat:     Mouth: Mucous membranes are moist.     Pharynx: Oropharynx is clear. No oropharyngeal exudate or posterior oropharyngeal erythema.  Eyes:     General: No scleral icterus.    Extraocular Movements: Extraocular movements intact.     Conjunctiva/sclera: Conjunctivae normal.     Pupils: Pupils are equal, round, and reactive to light.     Comments: No mucosal pallor  Cardiovascular:     Rate and Rhythm: Normal rate and regular rhythm.     Pulses: Normal pulses.     Heart sounds: No murmur heard.    No friction rub. No gallop.  Pulmonary:     Effort: Pulmonary effort is normal.     Breath  sounds: Normal breath sounds. No wheezing, rhonchi or rales.  Abdominal:     General: Abdomen is flat. Bowel sounds are normal. There is no distension.     Palpations: Abdomen is soft.     Tenderness: There is no abdominal tenderness.  Musculoskeletal:     Cervical back: Normal range of motion.     Comments: No obvious deformity on inspection of the right knee There is tenderness to palpation over the anserine bursa and posterior aspect of the knee.   No palpable masses appreciated. ROM intact No pain elicited with valgus/varus stress application Negative patellar apprehension No significant tenderness along the patellar tendon or at the site of patellar tendon insertion Negative McMurray/Thessaly's  Negative anterior drawer  Lymphadenopathy:     Cervical: No cervical adenopathy.  Skin:    General: Skin is warm and dry.     Capillary Refill: Capillary refill takes less than 2 seconds.     Coloration: Skin is not jaundiced.  Neurological:     General: No focal deficit present.     Mental Status: She is alert and oriented to person, place, and time.     Motor: No weakness.  Psychiatric:        Mood and Affect: Mood normal.        Behavior: Behavior normal.        Thought Content: Thought content normal.    No results found for any visits on 06/04/22.  Last CBC Lab Results  Component Value Date   WBC 5.9 02/06/2022   HGB 13.8 02/06/2022   HCT 41.0 02/06/2022   MCV 84 02/06/2022   MCH 28.1 02/06/2022   RDW 12.9 02/06/2022   PLT 315 44/62/8638   Last metabolic panel Lab Results  Component Value Date   GLUCOSE 97 02/06/2022   NA 141 02/06/2022   K 4.7 02/06/2022   CL 102 02/06/2022   CO2 24 02/06/2022   BUN 16 02/06/2022   CREATININE 0.62 02/06/2022   EGFR 106 02/06/2022   CALCIUM 9.6 02/06/2022   PROT 7.3 02/14/2021   ALBUMIN 4.2 02/14/2021   LABGLOB 2.5 11/05/2020   AGRATIO 1.8 11/05/2020   BILITOT 0.7 02/14/2021   ALKPHOS 113 02/14/2021   AST 53 (H)  02/14/2021   ALT 43 02/14/2021   ANIONGAP 8 02/14/2021   Last lipids Lab Results  Component Value Date   CHOL 181 02/06/2022   HDL 43 02/06/2022   LDLCALC 100 (H) 02/06/2022  TRIG 222 (H) 02/06/2022   CHOLHDL 4.2 02/06/2022   Last hemoglobin A1c Lab Results  Component Value Date   HGBA1C 5.2 12/07/2019   Last thyroid functions Lab Results  Component Value Date   TSH 6.140 (H) 02/06/2022   Last vitamin D Lab Results  Component Value Date   VD25OH 39.8 02/06/2022   Last vitamin B12 and Folate Lab Results  Component Value Date   [id number] 789 02/09/2020   The 10-year ASCVD risk score (Arnett DK, et al., 2019) is: 1.5%    Assessment & Plan:   Problem List Items Addressed This Visit          Migraine without aura and without status migrainosus, not intractable    Chronic condition.  Stable without change to pattern or intensity of migraines. -Aimovig and Imitrex refilled        Gastroesophageal reflux disease - Primary    Symptoms well controlled on Protonix.  Refill provided today.      Acute pain of right lower extremity    She reports recent right knee and heel pain.  Pain is located at the medial and posterior portions of the right knee.  There is tenderness palpation over these locations on exam, no palpable masses.  Possible anserine bursitis, patellar tendinitis, or a Baker's cyst.  Her heel pain is worse in the morning and improves with ambulation.  Potentially consistent with plantar fasciitis. -I have prescribed Mobic 7.5 mg x14 days and provided her with home PT exercises for her knee and heel. -Plan for follow-up in 4 weeks.  If there is no improvement in pain at that time, consider referral to sports medicine      Fatigue    She endorses fatigue for the last 4 days, noting that she just wants to lay in bed and does not feel like doing anything.  Noted history of hypothyroidism and iron deficiency.  She is not currently on levothyroxine and is not  taking iron supplementation.  Denies associated symptoms of unintentional weight loss, fever/chills, night sweats, and melena/hematochezia. -Basic labs ordered today, including TSH/T4 and iron studies. -Follow-up in 4 weeks for reassessment      Preventative health care    -Basic labs ordered today, including one-time HCV screening -Influenza vaccine administered today -Shingrix vaccine administered today -Cologuard ordered      Relevant Orders   HIV antibody (with reflex)   Return in about 4 weeks (around 07/02/2022).    Johnette Abraham, MD

## 2022-06-04 NOTE — Assessment & Plan Note (Signed)
Symptoms well controlled on Protonix.  Refill provided today.

## 2022-06-04 NOTE — Telephone Encounter (Signed)
Patient called in needs pre auth on    Erenumab-aooe (AIMOVIG) 140 MG/ML SOAJ   meloxicam (MOBIC) 7.5 MG tablet

## 2022-06-06 LAB — CBC WITH DIFFERENTIAL/PLATELET
Basophils Absolute: 0 10*3/uL (ref 0.0–0.2)
Basos: 1 %
EOS (ABSOLUTE): 0.1 10*3/uL (ref 0.0–0.4)
Eos: 1 %
Hematocrit: 38.7 % (ref 34.0–46.6)
Hemoglobin: 12.8 g/dL (ref 11.1–15.9)
Immature Grans (Abs): 0 10*3/uL (ref 0.0–0.1)
Immature Granulocytes: 0 %
Lymphocytes Absolute: 2.1 10*3/uL (ref 0.7–3.1)
Lymphs: 36 %
MCH: 28.3 pg (ref 26.6–33.0)
MCHC: 33.1 g/dL (ref 31.5–35.7)
MCV: 85 fL (ref 79–97)
Monocytes Absolute: 0.4 10*3/uL (ref 0.1–0.9)
Monocytes: 6 %
Neutrophils Absolute: 3.2 10*3/uL (ref 1.4–7.0)
Neutrophils: 56 %
Platelets: 317 10*3/uL (ref 150–450)
RBC: 4.53 x10E6/uL (ref 3.77–5.28)
RDW: 13.1 % (ref 11.7–15.4)
WBC: 5.8 10*3/uL (ref 3.4–10.8)

## 2022-06-06 LAB — TSH+FREE T4
Free T4: 1.08 ng/dL (ref 0.82–1.77)
TSH: 3.44 u[IU]/mL (ref 0.450–4.500)

## 2022-06-06 LAB — CMP14+EGFR
ALT: 18 IU/L (ref 0–32)
AST: 36 IU/L (ref 0–40)
Albumin/Globulin Ratio: 1.8 (ref 1.2–2.2)
Albumin: 4.7 g/dL (ref 3.8–4.9)
Alkaline Phosphatase: 139 IU/L — ABNORMAL HIGH (ref 44–121)
BUN/Creatinine Ratio: 13 (ref 9–23)
BUN: 11 mg/dL (ref 6–24)
Bilirubin Total: 0.2 mg/dL (ref 0.0–1.2)
CO2: 23 mmol/L (ref 20–29)
Calcium: 9.3 mg/dL (ref 8.7–10.2)
Chloride: 102 mmol/L (ref 96–106)
Creatinine, Ser: 0.82 mg/dL (ref 0.57–1.00)
Globulin, Total: 2.6 g/dL (ref 1.5–4.5)
Glucose: 89 mg/dL (ref 70–99)
Potassium: 4.2 mmol/L (ref 3.5–5.2)
Sodium: 140 mmol/L (ref 134–144)
Total Protein: 7.3 g/dL (ref 6.0–8.5)
eGFR: 85 mL/min/{1.73_m2} (ref >59)

## 2022-06-06 LAB — HEMOGLOBIN A1C
Est. average glucose Bld gHb Est-mCnc: 120 mg/dL
Hgb A1c MFr Bld: 5.8 % — ABNORMAL HIGH (ref 4.8–5.6)

## 2022-06-06 LAB — HIV ANTIBODY (ROUTINE TESTING W REFLEX): HIV Screen 4th Generation wRfx: NONREACTIVE

## 2022-06-06 LAB — IRON,TIBC AND FERRITIN PANEL
Ferritin: 61 ng/mL (ref 15–150)
Iron Saturation: 23 % (ref 15–55)
Iron: 80 ug/dL (ref 27–159)
Total Iron Binding Capacity: 349 ug/dL (ref 250–450)
UIBC: 269 ug/dL (ref 131–425)

## 2022-06-06 LAB — LIPID PANEL
Chol/HDL Ratio: 3.8 ratio (ref 0.0–4.4)
Cholesterol, Total: 191 mg/dL (ref 100–199)
HDL: 50 mg/dL (ref >39)
LDL Chol Calc (NIH): 110 mg/dL — ABNORMAL HIGH (ref 0–99)
Triglycerides: 178 mg/dL — ABNORMAL HIGH (ref 0–149)
VLDL Cholesterol Cal: 31 mg/dL (ref 5–40)

## 2022-06-06 LAB — VITAMIN D 25 HYDROXY (VIT D DEFICIENCY, FRACTURES): Vit D, 25-Hydroxy: 39 ng/mL (ref 30.0–100.0)

## 2022-06-06 LAB — B12 AND FOLATE PANEL
Folate: 6.8 ng/mL (ref >3.0)
Vitamin B-12: 896 pg/mL (ref 232–1245)

## 2022-06-11 ENCOUNTER — Telehealth: Payer: Self-pay

## 2022-06-11 NOTE — Telephone Encounter (Signed)
Patient returning lab result call 

## 2022-06-12 NOTE — Telephone Encounter (Signed)
Spoke to pt

## 2022-06-17 ENCOUNTER — Ambulatory Visit (HOSPITAL_COMMUNITY)
Admission: RE | Admit: 2022-06-17 | Discharge: 2022-06-17 | Disposition: A | Discharge status: Home / Self Care | Payer: 59 | Source: Ambulatory Visit | Attending: Nurse Practitioner | Admitting: Nurse Practitioner

## 2022-06-17 DIAGNOSIS — Z1231 Encounter for screening mammogram for malignant neoplasm of breast: Secondary | ICD-10-CM | POA: Insufficient documentation

## 2022-06-23 ENCOUNTER — Other Ambulatory Visit (HOSPITAL_COMMUNITY): Payer: Self-pay | Admitting: Nurse Practitioner

## 2022-06-23 DIAGNOSIS — R928 Other abnormal and inconclusive findings on diagnostic imaging of breast: Secondary | ICD-10-CM

## 2022-06-25 ENCOUNTER — Encounter (HOSPITAL_COMMUNITY): Payer: Self-pay

## 2022-06-25 ENCOUNTER — Ambulatory Visit (HOSPITAL_COMMUNITY)
Admission: RE | Admit: 2022-06-25 | Discharge: 2022-06-25 | Disposition: A | Discharge status: Home / Self Care | Payer: 59 | Source: Ambulatory Visit | Attending: Nurse Practitioner | Admitting: Nurse Practitioner

## 2022-06-25 DIAGNOSIS — R928 Other abnormal and inconclusive findings on diagnostic imaging of breast: Secondary | ICD-10-CM

## 2022-06-26 NOTE — Progress Notes (Signed)
Normal mammogram, repeat in one year

## 2022-07-03 DIAGNOSIS — Z1212 Encounter for screening for malignant neoplasm of rectum: Secondary | ICD-10-CM | POA: Diagnosis not present

## 2022-07-03 DIAGNOSIS — Z1211 Encounter for screening for malignant neoplasm of colon: Secondary | ICD-10-CM | POA: Diagnosis not present

## 2022-07-09 ENCOUNTER — Ambulatory Visit (INDEPENDENT_AMBULATORY_CARE_PROVIDER_SITE_OTHER): Payer: 59 | Admitting: Internal Medicine

## 2022-07-09 ENCOUNTER — Encounter: Payer: Self-pay | Admitting: Internal Medicine

## 2022-07-09 VITALS — BP 127/83 | HR 66 | Ht 62.0 in | Wt 130.2 lb

## 2022-07-09 DIAGNOSIS — Z1331 Encounter for screening for depression: Secondary | ICD-10-CM | POA: Diagnosis not present

## 2022-07-09 DIAGNOSIS — R7303 Prediabetes: Secondary | ICD-10-CM | POA: Diagnosis not present

## 2022-07-09 DIAGNOSIS — M79604 Pain in right leg: Secondary | ICD-10-CM | POA: Diagnosis not present

## 2022-07-09 DIAGNOSIS — Z0000 Encounter for general adult medical examination without abnormal findings: Secondary | ICD-10-CM | POA: Diagnosis not present

## 2022-07-09 DIAGNOSIS — Z0001 Encounter for general adult medical examination with abnormal findings: Secondary | ICD-10-CM | POA: Diagnosis not present

## 2022-07-09 DIAGNOSIS — E7800 Pure hypercholesterolemia, unspecified: Secondary | ICD-10-CM

## 2022-07-09 NOTE — Progress Notes (Signed)
Established Patient Office Visit  Subjective   Patient ID: Jennifer Wright, female    DOB: Dec 28, 1965  Age: 56 y.o. MRN: [id number]  Chief Complaint  Patient presents with   Follow-up   Ms. Rottmann returns to care today.  She is a 56 year old woman with a past medical history significant for HLD, migraines, GERD, and hypothyroidism.  She was last seen by me on 06/04/2022 in routine follow-up.  At that time she requested medication refills and endorsed fatigue and right knee pain.  She was prescribed Mobic x14 days for treatment of right knee/heel pain.  Basic labs were also ordered.  There have been no acute interval events.  Today Ms. Cassel continues to endorse pain in her right knee and heel.  There has been no improvement.  She states that home physical therapy helped slightly, but did not significantly relieve her pain.  She is worried about lab results from her last appointment and would like to review them again today.  Her PHQ-9 score is elevated today (16), which she attributes to worry about her recent lab results.  She currently denies SI/HI and is not interested in starting a medication or a referral to psychiatry.  Acute concerns, chronic medical conditions, and outstanding preventative healthcare maintenance items discussed today are individually addressed in A/P below.  Past Medical History:  Diagnosis Date   Anxiety    Asbestos exposure    Asthma    Depression    Encounter for screening for malignant neoplasm of colon 12/07/2019   Family history of diabetes mellitus 12/07/2019   Family history of hyperlipidemia 12/07/2019   Generalized headaches 12/07/2019   GERD (gastroesophageal reflux disease)    Hyperlipemia    Neck and shoulder pain 12/07/2019   Shortness of breath 12/07/2019   Thyroid disease    Upper back pain 12/07/2019   Past Surgical History:  Procedure Laterality Date   ABDOMINAL HYSTERECTOMY     TUBAL LIGATION     Social History   Tobacco Use   Smoking status:  Never   Smokeless tobacco: Never  Vaping Use   Vaping Use: Never used  Substance Use Topics   Alcohol use: Yes    Comment: socially   Drug use: Never   Family History  Problem Relation Age of Onset   Cancer Mother        stomach cancer   Cancer Brother        stomach cancer   Colon cancer Neg Hx    Colon polyps Neg Hx    No Known Allergies  Review of Systems  Musculoskeletal:  Positive for joint pain (Right knee/heel pain).  All other systems reviewed and are negative.    Objective:     BP 127/83   Pulse 66   Ht $R'5\' 2"'Cu$  (1.575 m)   Wt 130 lb 3.2 oz (59.1 kg)   SpO2 98%   BMI 23.81 kg/m  BP Readings from Last 3 Encounters:  07/09/22 127/83  06/04/22 108/60  03/12/22 137/87      Physical Exam Constitutional:      General: She is not in acute distress.    Appearance: Normal appearance. She is not toxic-appearing.  HENT:     Head: Normocephalic and atraumatic.     Right Ear: External ear normal.     Left Ear: External ear normal.     Nose: Nose normal. No congestion or rhinorrhea.     Mouth/Throat:     Mouth: Mucous membranes are moist.  Pharynx: Oropharynx is clear. No oropharyngeal exudate or posterior oropharyngeal erythema.  Eyes:     General: No scleral icterus.    Extraocular Movements: Extraocular movements intact.     Conjunctiva/sclera: Conjunctivae normal.     Pupils: Pupils are equal, round, and reactive to light.  Cardiovascular:     Rate and Rhythm: Normal rate and regular rhythm.     Pulses: Normal pulses.     Heart sounds: Normal heart sounds. No murmur heard.    No friction rub. No gallop.  Pulmonary:     Effort: Pulmonary effort is normal.     Breath sounds: Normal breath sounds. No wheezing, rhonchi or rales.  Abdominal:     General: Abdomen is flat. Bowel sounds are normal. There is no distension.     Palpations: Abdomen is soft.     Tenderness: There is no abdominal tenderness.  Musculoskeletal:        General: Tenderness  present. No swelling.     Cervical back: Normal range of motion.     Right lower leg: No edema.     Left lower leg: No edema.     Comments: No obvious deformity on inspection of the right knee ROM generally intact, however there is pain elicited with flexion beyond 60 degrees. There is pain elicited with application of valgus stress Positive McMurray/Thessaly's Negative anterior/posterior drawer, negative Lachman's Negative patellar apprehension  Lymphadenopathy:     Cervical: No cervical adenopathy.  Skin:    General: Skin is warm and dry.     Capillary Refill: Capillary refill takes less than 2 seconds.     Coloration: Skin is not jaundiced.  Neurological:     General: No focal deficit present.     Mental Status: She is alert and oriented to person, place, and time.  Psychiatric:        Mood and Affect: Mood normal.        Behavior: Behavior normal.    Last CBC Lab Results  Component Value Date   WBC 5.8 06/04/2022   HGB 12.8 06/04/2022   HCT 38.7 06/04/2022   MCV 85 06/04/2022   MCH 28.3 06/04/2022   RDW 13.1 06/04/2022   PLT 317 42/35/3614   Last metabolic panel Lab Results  Component Value Date   GLUCOSE 89 06/04/2022   NA 140 06/04/2022   K 4.2 06/04/2022   CL 102 06/04/2022   CO2 23 06/04/2022   BUN 11 06/04/2022   CREATININE 0.82 06/04/2022   EGFR 85 06/04/2022   CALCIUM 9.3 06/04/2022   PROT 7.3 06/04/2022   ALBUMIN 4.7 06/04/2022   LABGLOB 2.6 06/04/2022   AGRATIO 1.8 06/04/2022   BILITOT 0.2 06/04/2022   ALKPHOS 139 (H) 06/04/2022   AST 36 06/04/2022   ALT 18 06/04/2022   ANIONGAP 8 02/14/2021   Last lipids Lab Results  Component Value Date   CHOL 191 06/04/2022   HDL 50 06/04/2022   LDLCALC 110 (H) 06/04/2022   TRIG 178 (H) 06/04/2022   CHOLHDL 3.8 06/04/2022   Last hemoglobin A1c Lab Results  Component Value Date   HGBA1C 5.8 (H) 06/04/2022   Last thyroid functions Lab Results  Component Value Date   TSH 3.440 06/04/2022   Last  vitamin D Lab Results  Component Value Date   VD25OH 39.0 06/04/2022   Last vitamin B12 and Folate Lab Results  Component Value Date   [id number] 619 06/04/2022   FOLATE 6.8 06/04/2022   The 10-year ASCVD risk score (Arnett  DK, et al., 2019) is: 1.9%    Assessment & Plan:   Problem List Items Addressed This Visit       Acute pain of right lower extremity - Primary    She continues to endorse right knee and heel pain that is unchanged in severity today.  Pain remains located at the medial and posterior portions of the right knee.  There was no acute event or trauma at the onset of pain.  Today she has pain with valgus stress and positive Thessaly/McMurray's on exam.  She was initially treated with Mobic x14 days and home PT, which did not significantly improve her symptoms. -I have placed a referral to orthopedic surgery for evaluation today      Preventative health care    Returning for follow-up today. -She declines Tdap vaccination -States that her Cologuard is currently in process      Prediabetes    A1c 5.8 on labs from last month, placing her in the prediabetic range.  We reviewed lifestyle modifications, including appropriate diet and exercise changes, that we will reduce her chances of progressing to diabetes mellitus.      Elevated LDL cholesterol level    LDL 110 on labs from last month.  Her 10-year ASCVD risk is 1.9%.  We reviewed lifestyle modifications aimed at lowering her cholesterol, namely limiting fatty/fried foods.      Positive screening for depression on 9-item Patient Health Questionnaire (PHQ-9)    PHQ-9 score 16 today.  She denies SI/HI and attributes most of her symptoms to worry over recent lab results and the need to make significant lifestyle modifications.  She seems very motivated to do so and declines medication for treatment of depression today or a referral to psychiatry.      Return in about 3 months (around 10/09/2022).    Johnette Abraham, MD

## 2022-07-15 ENCOUNTER — Encounter: Payer: Self-pay | Admitting: Internal Medicine

## 2022-07-15 DIAGNOSIS — Z1331 Encounter for screening for depression: Secondary | ICD-10-CM | POA: Insufficient documentation

## 2022-07-15 DIAGNOSIS — R7303 Prediabetes: Secondary | ICD-10-CM | POA: Insufficient documentation

## 2022-07-15 DIAGNOSIS — E7800 Pure hypercholesterolemia, unspecified: Secondary | ICD-10-CM | POA: Insufficient documentation

## 2022-07-15 NOTE — Assessment & Plan Note (Signed)
She continues to endorse right knee and heel pain that is unchanged in severity today.  Pain remains located at the medial and posterior portions of the right knee.  There was no acute event or trauma at the onset of pain.  Today she has pain with valgus stress and positive Thessaly/McMurray's on exam.  She was initially treated with Mobic x14 days and home PT, which did not significantly improve her symptoms. -I have placed a referral to orthopedic surgery for evaluation today

## 2022-07-15 NOTE — Assessment & Plan Note (Signed)
PHQ-9 score 16 today.  She denies SI/HI and attributes most of her symptoms to worry over recent lab results and the need to make significant lifestyle modifications.  She seems very motivated to do so and declines medication for treatment of depression today or a referral to psychiatry.

## 2022-07-15 NOTE — Assessment & Plan Note (Signed)
A1c 5.8 on labs from last month, placing her in the prediabetic range.  We reviewed lifestyle modifications, including appropriate diet and exercise changes, that we will reduce her chances of progressing to diabetes mellitus.

## 2022-07-15 NOTE — Assessment & Plan Note (Signed)
LDL 110 on labs from last month.  Her 10-year ASCVD risk is 1.9%.  We reviewed lifestyle modifications aimed at lowering her cholesterol, namely limiting fatty/fried foods.

## 2022-07-15 NOTE — Assessment & Plan Note (Signed)
Returning for follow-up today. -She declines Tdap vaccination -States that her Cologuard is currently in process

## 2022-07-18 LAB — COLOGUARD: COLOGUARD: NEGATIVE

## 2022-08-05 ENCOUNTER — Encounter: Payer: 59 | Admitting: Orthopaedic Surgery

## 2022-08-11 ENCOUNTER — Ambulatory Visit: Payer: 59 | Admitting: Orthopaedic Surgery

## 2022-08-11 ENCOUNTER — Encounter: Payer: Self-pay | Admitting: Orthopaedic Surgery

## 2022-08-11 ENCOUNTER — Ambulatory Visit (INDEPENDENT_AMBULATORY_CARE_PROVIDER_SITE_OTHER): Payer: 59

## 2022-08-11 VITALS — BP 120/84 | HR 72 | Ht 62.0 in | Wt 130.8 lb

## 2022-08-11 DIAGNOSIS — M25561 Pain in right knee: Secondary | ICD-10-CM

## 2022-08-11 DIAGNOSIS — G8929 Other chronic pain: Secondary | ICD-10-CM

## 2022-08-11 MED ORDER — NAPROXEN 500 MG PO TABS: 500.0000 mg | ORAL_TABLET | Freq: Two times a day (BID) | ORAL | 5 refills | Status: DC

## 2022-08-11 NOTE — Progress Notes (Signed)
Subjective:    Patient ID: Jennifer Wright, female    DOB: 1966/08/23, 56 y.o.   MRN: [id number]  HPI She has had right knee pain with swelling and popping at times over the last six plus months.  She has no trauma, no redness, no numbness.  She has had some pain of the right calf at times and around the Achilles of the right ankle.  She has tried rest, ice, elevation.  She has been seen by [facility] and took Mobic earlier in the year for this but it did not help.  She is concerned it is not getting better.  She has no giving way.     Review of Systems  Constitutional:  Positive for activity change.  Respiratory:  Positive for shortness of breath.   Musculoskeletal:  Positive for arthralgias, gait problem and joint swelling.  All other systems reviewed and are negative. For Review of Systems, all other systems reviewed and are negative.  The following is a summary of the past history medically, past history surgically, known current medicines, social history and family history.  This information is gathered electronically by the computer from prior information and documentation.  I review this each visit and have found including this information at this point in the chart is beneficial and informative.   Past Medical History:  Diagnosis Date   Anxiety    Asbestos exposure    Asthma    Depression    Encounter for screening for malignant neoplasm of colon 12/07/2019   Family history of diabetes mellitus 12/07/2019   Family history of hyperlipidemia 12/07/2019   Generalized headaches 12/07/2019   GERD (gastroesophageal reflux disease)    Hyperlipemia    Neck and shoulder pain 12/07/2019   Shortness of breath 12/07/2019   Thyroid disease    Upper back pain 12/07/2019    Past Surgical History:  Procedure Laterality Date   ABDOMINAL HYSTERECTOMY     TUBAL LIGATION      Current Outpatient Medications on File Prior to Visit  Medication Sig Dispense Refill   Erenumab-aooe  (AIMOVIG) 140 MG/ML SOAJ Inject 140 mg into the skin every 28 (twenty-eight) days. 1.12 mL 11   pantoprazole (PROTONIX) 40 MG tablet Take 1 tablet (40 mg total) by mouth daily. 90 tablet 0   SUMAtriptan (IMITREX) 100 MG tablet TAKE 1 TABLET BY MOUTH AT EARLIEST ONSET OF HEADACHE. MAY REPEAT IN 2 HOURS IF HEADACHE PERSISTS OR RECURS. MAXIMUM 2 TABLETS IN 24 HOURS. 10 tablet 0   VENTOLIN HFA 108 (90 Base) MCG/ACT inhaler INHALE 1 PUFF BY MOUTH EVERY 6 HOURS AS NEEDED FOR WHEEZING OR SHORTNESS OF BREATH 18 g 0   No current facility-administered medications on file prior to visit.    Social History   Socioeconomic History   Marital status: Divorced    Spouse name: Not on file   Number of children: 6   Years of education: Not on file   Highest education level: 1st grade  Occupational History   Not on file  Tobacco Use   Smoking status: Never   Smokeless tobacco: Never  Vaping Use   Vaping Use: Never used  Substance and Sexual Activity   Alcohol use: Yes    Comment: socially   Drug use: Never   Sexual activity: Yes    Birth control/protection: Condom, Surgical  Other Topics Concern   Not on file  Social History Narrative   Moved from [place] at 53 years old  6 children   Lives with daughter Corrie Dandy and Rolinda Roan' and her two children       Enjoys working around the house      Diet: eats all food groups   Caffeine: coffee 2-3 cups   Water: 4-6 cups daily      Wears seat belt    Does not drive, but can.   Smoke detectors at home           Social Determinants of Health   Financial Resource Strain: Medium Risk (12/20/2020)   Overall Financial Resource Strain (CARDIA)    Difficulty of Paying Living Expenses: Somewhat hard  Food Insecurity: Food Insecurity Present (12/20/2020)   Hunger Vital Sign    Worried About Running Out of Food in the Last Year: Sometimes true    Ran Out of Food in the Last Year: Sometimes true  Transportation Needs: No Transportation Needs (12/20/2020)    PRAPARE - Administrator, Civil Service (Medical): No    Lack of Transportation (Non-Medical): No  Physical Activity: Insufficiently Active (12/20/2020)   Exercise Vital Sign    Days of Exercise per Week: 2 days    Minutes of Exercise per Session: 30 min  Stress: No Stress Concern Present (12/20/2020)   [employer] of Occupational Health - Occupational Stress Questionnaire    Feeling of Stress : Only a little  Social Connections: Socially Isolated (12/20/2020)   Social Connection and Isolation Panel [NHANES]    Frequency of Communication with Friends and Family: More than three times a week    Frequency of Social Gatherings with Friends and Family: Twice a week    Attends Religious Services: Never    Database administrator or Organizations: No    Attends Banker Meetings: Never    Marital Status: Divorced  Catering manager Violence: Not At Risk (12/20/2020)   Humiliation, Afraid, Rape, and Kick questionnaire    Fear of Current or Ex-Partner: No    Emotionally Abused: No    Physically Abused: No    Sexually Abused: No    Family History  Problem Relation Age of Onset   Cancer Mother        stomach cancer   Cancer Brother        stomach cancer   Colon cancer Neg Hx    Colon polyps Neg Hx     BP 120/84   Pulse 72   Ht 5\' 2"  (1.575 m)   Wt 130 lb 12.8 oz (59.3 kg)   SpO2 98%   BMI 23.92 kg/m   Body mass index is 23.92 kg/m.      Objective:   Physical Exam Vitals and nursing note reviewed. Exam conducted with a chaperone present.  Constitutional:      Appearance: She is well-developed.  HENT:     Head: Normocephalic and atraumatic.  Eyes:     Conjunctiva/sclera: Conjunctivae normal.     Pupils: Pupils are equal, round, and reactive to light.  Cardiovascular:     Rate and Rhythm: Normal rate and regular rhythm.  Pulmonary:     Effort: Pulmonary effort is normal.  Abdominal:     Palpations: Abdomen is soft.  Musculoskeletal:     Cervical  back: Normal range of motion and neck supple.       Legs:  Skin:    General: Skin is warm and dry.  Neurological:     Mental Status: She is alert and oriented to person, place, and time.  Cranial Nerves: No cranial nerve deficit.     Motor: No abnormal muscle tone.     Coordination: Coordination normal.     Deep Tendon Reflexes: Reflexes are normal and symmetric. Reflexes normal.  Psychiatric:        Behavior: Behavior normal.        Thought Content: Thought content normal.        Judgment: Judgment normal.           Assessment & Plan:   Encounter Diagnosis  Name Primary?   Chronic pain of right knee Yes   I will begin Naprosyn 500 po bid pc.  She may need MRI.  Return in two weeks.  Call if any problem.  Precautions discussed.  Electronically Signed Darreld Mclean, MD 11/21/20232:14 PM

## 2022-08-11 NOTE — Patient Instructions (Addendum)
THE MEDICATION DR.KEELING IS PRESCRIBING FOR YOU TODAY CAN TAKE UP TO 10 DAYS TO BUILD UP A IN YOUR SYSTEM.   YOU MUST EAT BEFORE YOU TAKE THE MEDICATION. IF IT UPSETS YOUR STOMACH AT ALL, STOP TAKING THE MEDICATION. THE MOST COMMON SIDE EFFECT OF THIS TYPE OF MEDICINE IS AN UPSET STOMACH.  CERTAIN ACTIVITIES MAY CAUSE MORE PAIN THAN OTHERS. TRY TO LIMIT THE ACTIVITIES THAT CAUSE PAIN. AFTER YOU'VE BEEN SITTING FOR A LONG TIME GET UP SLOWLY TO MAKE SURE YOU HAVE YOUR BALANCE. BE SURE TO ASK FOR HELP IF YOU NEED IT.   As the weather changes and gets cooler, you may notice you are affected more. You may have more pain in your joints. This is normal. Dress warmly and make sure that area is covered well.   Dr.Keeling is here all day on Tuesdays, Wednesday mornings, and Thursday mornings. If you need anything such as a medication refill, please either call BEFORE the end of the day on [facility] or send a message through [place]. Your pharmacy can send a refill request for you. Calling by the end of the day on [facility] allows [place] time to send Dr.Keeling the request and for him to respond before he leaves on Thursdays.  If Dr. Hilda Lias is out of the office, we may send it to one of the other providers and they may not refill it for the same amount that your original prescription is for.

## 2022-08-17 ENCOUNTER — Ambulatory Visit: Payer: Medicaid Other | Admitting: Nurse Practitioner

## 2022-08-18 ENCOUNTER — Ambulatory Visit: Payer: Medicaid Other | Admitting: Internal Medicine

## 2022-08-25 ENCOUNTER — Ambulatory Visit: Payer: 59 | Admitting: Orthopaedic Surgery

## 2022-09-01 ENCOUNTER — Encounter: Payer: Self-pay | Admitting: Orthopaedic Surgery

## 2022-09-01 ENCOUNTER — Ambulatory Visit: Payer: 59 | Admitting: Orthopaedic Surgery

## 2022-09-01 VITALS — BP 120/76 | HR 72 | Ht 62.0 in | Wt 130.0 lb

## 2022-09-01 DIAGNOSIS — M25561 Pain in right knee: Secondary | ICD-10-CM | POA: Diagnosis not present

## 2022-09-01 DIAGNOSIS — G8929 Other chronic pain: Secondary | ICD-10-CM

## 2022-09-01 NOTE — Progress Notes (Signed)
My knee is only a little better.  It still hurts.  She has pain of the right knee with swelling, pain and giving way.  The Naprosyn has helped some but she still has the giving way and pain.  She has no new trauma.  I will scheduled MRI of the right knee.  Right knee has effusion, medial pain, crepitus, positive medial McMurray, ROM 0 to 105, limp right, NV intact, no distal edema.  Encounter Diagnosis  Name Primary?   Chronic pain of right knee Yes   Get MRI.  Continue the Naprosyn.  Return in three weeks.  Call if any problem.  Precautions discussed.  Electronically Signed Darreld Mclean, MD 12/12/20238:47 AM

## 2022-09-01 NOTE — Patient Instructions (Addendum)
Dr.Keeling wants you to have an MRI of your knee to look at the cartilage and tendons. Because of your insurance, you have to have the MRI done at [facility].   [facility] [address] [place], [place] [address] Phone [phone]   Call them and make your appointment and we will work on getting approval for the imaging.

## 2022-09-08 ENCOUNTER — Other Ambulatory Visit: Payer: 59

## 2022-09-23 ENCOUNTER — Other Ambulatory Visit: Payer: 59

## 2022-10-09 ENCOUNTER — Ambulatory Visit: Payer: Medicaid Other | Admitting: Internal Medicine

## 2022-11-17 ENCOUNTER — Encounter: Payer: Self-pay | Admitting: Internal Medicine

## 2022-11-17 ENCOUNTER — Ambulatory Visit (INDEPENDENT_AMBULATORY_CARE_PROVIDER_SITE_OTHER): Payer: Self-pay | Admitting: Internal Medicine

## 2022-11-17 ENCOUNTER — Ambulatory Visit (HOSPITAL_COMMUNITY)
Admission: RE | Admit: 2022-11-17 | Discharge: 2022-11-17 | Disposition: A | Discharge status: Home / Self Care | Payer: Medicaid Other | Source: Ambulatory Visit | Attending: Internal Medicine | Admitting: Internal Medicine

## 2022-11-17 VITALS — BP 118/79 | HR 74 | Temp 98.1°F | Resp 16 | Ht 62.0 in | Wt 134.0 lb

## 2022-11-17 DIAGNOSIS — Z789 Other specified health status: Secondary | ICD-10-CM

## 2022-11-17 DIAGNOSIS — J302 Other seasonal allergic rhinitis: Secondary | ICD-10-CM

## 2022-11-17 MED ORDER — FLUTICASONE PROPIONATE 50 MCG/ACT NA SUSP: 2.0000 | Freq: Every day | NASAL | 6 refills | Status: DC

## 2022-11-17 NOTE — Patient Instructions (Signed)
Thank you, [name] for allowing [place] to provide your care today.   I have ordered the following labs for you:   Lab Orders         Cdiff NAA+O+P+Stool Culture         Blood culture (routine single)         CBC with Differential/Platelet         BMP8+EGFR        Reminders: Go to [facility] to have chest x-ray done today.    Tamsen Snider, M.D.

## 2022-11-18 ENCOUNTER — Encounter: Payer: Self-pay | Admitting: Internal Medicine

## 2022-11-18 ENCOUNTER — Other Ambulatory Visit: Payer: Self-pay | Admitting: Internal Medicine

## 2022-11-18 ENCOUNTER — Telehealth: Payer: Self-pay | Admitting: Internal Medicine

## 2022-11-18 DIAGNOSIS — Z789 Other specified health status: Secondary | ICD-10-CM | POA: Insufficient documentation

## 2022-11-18 DIAGNOSIS — J302 Other seasonal allergic rhinitis: Secondary | ICD-10-CM | POA: Insufficient documentation

## 2022-11-18 DIAGNOSIS — R911 Solitary pulmonary nodule: Secondary | ICD-10-CM

## 2022-11-18 LAB — CBC WITH DIFFERENTIAL/PLATELET
Basophils Absolute: 0.1 10*3/uL (ref 0.0–0.2)
Basos: 1 %
EOS (ABSOLUTE): 0.1 10*3/uL (ref 0.0–0.4)
Eos: 1 %
Hematocrit: 41.5 % (ref 34.0–46.6)
Hemoglobin: 13.8 g/dL (ref 11.1–15.9)
Immature Grans (Abs): 0.1 10*3/uL (ref 0.0–0.1)
Immature Granulocytes: 1 %
Lymphocytes Absolute: 2.4 10*3/uL (ref 0.7–3.1)
Lymphs: 38 %
MCH: 28.2 pg (ref 26.6–33.0)
MCHC: 33.3 g/dL (ref 31.5–35.7)
MCV: 85 fL (ref 79–97)
Monocytes Absolute: 0.5 10*3/uL (ref 0.1–0.9)
Monocytes: 7 %
Neutrophils Absolute: 3.4 10*3/uL (ref 1.4–7.0)
Neutrophils: 52 %
Platelets: 377 10*3/uL (ref 150–450)
RBC: 4.89 x10E6/uL (ref 3.77–5.28)
RDW: 13.2 % (ref 11.7–15.4)
WBC: 6.5 10*3/uL (ref 3.4–10.8)

## 2022-11-18 LAB — BMP8+EGFR
BUN/Creatinine Ratio: 17 (ref 9–23)
BUN: 10 mg/dL (ref 6–24)
CO2: 25 mmol/L (ref 20–29)
Calcium: 9.8 mg/dL (ref 8.7–10.2)
Chloride: 103 mmol/L (ref 96–106)
Creatinine, Ser: 0.59 mg/dL (ref 0.57–1.00)
Glucose: 86 mg/dL (ref 70–99)
Potassium: 4.2 mmol/L (ref 3.5–5.2)
Sodium: 143 mmol/L (ref 134–144)
eGFR: 107 mL/min/{1.73_m2} (ref >59)

## 2022-11-18 NOTE — Progress Notes (Signed)
   HPI:[name] is a 57 y.o. female who presents for evaluation of cough,congestion, fatigue, diarrhea, and episodes of dizziness. For the details of today's visit, please refer to the assessment and plan.  Physical Exam: Vitals:   11/17/22 1429  BP: 118/79  Pulse: 74  Resp: 16  Temp: 98.1 F (36.7 C)  TempSrc: Oral  SpO2: 96%  Weight: 134 lb (60.8 kg)  Height: 5' 2"$  (1.575 m)     Physical Exam Constitutional:      General: She is not in acute distress.    Appearance: She is not ill-appearing.  HENT:     Nose: Congestion present.  Cardiovascular:     Rate and Rhythm: Normal rate and regular rhythm.  Pulmonary:     Effort: Pulmonary effort is normal. No respiratory distress.     Breath sounds: No wheezing or rales.  Abdominal:     General: Bowel sounds are normal.     Tenderness: There is abdominal tenderness (llq). There is no rebound.      Assessment & Plan:   Jennifer Wright was seen today for dizziness.  Acute medical illness Assessment & Plan: Patient presents with multiple symptoms she has been experiencing for the last month since going on a trip to [place]. She was in [place] for 1 month and recently returned this past week.  When in [place] she was sick with elevated temperature 99 F, cough, and diarrhea. She has felt fatigued. She also has a dry cough. Diarrhea has improved. Her nephew was sick and tested positive for typhoid fever.  She was given an injection but does not know what medication she was given.  She also has a history of possible TB, but reports workup was negative. No dizziness today.  Patient with illness for one month. Will test stool and culture blood for Typhoid. Chest xray to look for active TB.  If workup negative will treat symptoms. If not improving will refer to ID for further evaluation.  - Cdiff NAA+O+P+Stool Culture - Blood culture (routine single) - CBC with Differential/Platelet - BMP8+EGFR - DG Chest 2  View      Orders: -     Cdiff NAA+O+P+Stool Culture -     Culture, blood (single) w Reflex to ID Panel -     CBC with Differential/Platelet -     BMP8+EGFR -     DG Chest 2 View  Seasonal allergies Assessment & Plan: Patient has history of seasonal allergies. Having nasal congestion. Working up acute illness. - fluticasone (FLONASE) 50 MCG/ACT nasal spray; Place 2 sprays into both nostrils daily.  Dispense: 16 g; Refill: 6   Orders: -     Fluticasone Propionate; Place 2 sprays into both nostrils daily.  Dispense: 16 g; Refill: 6      Jennifer Dy, MD

## 2022-11-18 NOTE — Telephone Encounter (Signed)
Returned call

## 2022-11-18 NOTE — Telephone Encounter (Signed)
[facility] called in on patient behalf. Needs to give report on patient.   Unexpected find on Chest xray   Call back info  [phone]

## 2022-11-18 NOTE — Assessment & Plan Note (Addendum)
Patient presents with multiple symptoms she has been experiencing for the last month since going on a trip to [place]. She was in [place] for 1 month and recently returned this past week.  When in [place] she was sick with elevated temperature 99 F, cough, and diarrhea. She has felt fatigued. She also has a dry cough. Diarrhea has improved. Her nephew was sick and tested positive for typhoid fever.  She was given an injection but does not know what medication she was given.  She also has a history of possible TB, but reports workup was negative. No dizziness today.  Patient with illness for one month. Will test stool and culture blood for Typhoid. Chest xray to look for active TB.  If workup negative will treat symptoms. If not improving will refer to ID for further evaluation.  - Cdiff NAA+O+P+Stool Culture - Blood culture (routine single) - CBC with Differential/Platelet - BMP8+EGFR - DG Chest 2 View

## 2022-11-18 NOTE — Progress Notes (Signed)
Nodular density right lung apex. CT Chest ordered for further evaluation.

## 2022-11-18 NOTE — Assessment & Plan Note (Signed)
Patient has history of seasonal allergies. Having nasal congestion. Working up acute illness. - fluticasone (FLONASE) 50 MCG/ACT nasal spray; Place 2 sprays into both nostrils daily.  Dispense: 16 g; Refill: 6

## 2022-11-19 ENCOUNTER — Encounter: Payer: Self-pay | Admitting: Radiology

## 2022-11-24 LAB — CULTURE, BLOOD (SINGLE)

## 2022-11-25 LAB — CDIFF NAA+O+P+STOOL CULTURE
E coli, Shiga toxin Assay: NEGATIVE
Toxigenic C. Difficile by PCR: NEGATIVE

## 2022-11-27 ENCOUNTER — Ambulatory Visit (HOSPITAL_COMMUNITY): Payer: Medicaid Other

## 2022-12-10 ENCOUNTER — Ambulatory Visit (HOSPITAL_COMMUNITY)
Admission: RE | Admit: 2022-12-10 | Discharge: 2022-12-10 | Disposition: A | Discharge status: Home / Self Care | Payer: 59 | Source: Ambulatory Visit | Attending: Internal Medicine | Admitting: Internal Medicine

## 2022-12-10 DIAGNOSIS — R911 Solitary pulmonary nodule: Secondary | ICD-10-CM | POA: Insufficient documentation

## 2023-08-05 ENCOUNTER — Encounter: Payer: Self-pay | Admitting: Family Medicine

## 2023-08-05 ENCOUNTER — Ambulatory Visit: Payer: 59 | Admitting: Family Medicine

## 2023-08-05 DIAGNOSIS — G43009 Migraine without aura, not intractable, without status migrainosus: Secondary | ICD-10-CM | POA: Diagnosis not present

## 2023-08-05 MED ORDER — AIMOVIG 140 MG/ML ~~LOC~~ SOAJ: 140.0000 mg | SUBCUTANEOUS | 0 refills | Status: DC

## 2023-08-05 MED ORDER — ONDANSETRON HCL 4 MG PO TABS: 4.0000 mg | ORAL_TABLET | Freq: Three times a day (TID) | ORAL | 0 refills | Status: DC | PRN

## 2023-08-05 MED ORDER — SUMATRIPTAN SUCCINATE 100 MG PO TABS: ORAL_TABLET | ORAL | 0 refills | Status: DC

## 2023-08-05 MED ORDER — IBUPROFEN 600 MG PO TABS: ORAL_TABLET | ORAL | 0 refills | Status: DC

## 2023-08-05 NOTE — Patient Instructions (Addendum)
F/U with pCP , Dr Durwin Nora in 3 to 6 weeks, call if you need  to be seen sooner  4 medications  are prescribed for migraine with nausea, take as directed  I will send in referral for f/u with your Neurologist since you have not been for over 12 months  Thanks for choosing [facility], we consider it a privelige to serve you.

## 2023-08-05 NOTE — Assessment & Plan Note (Signed)
Uncontrolled x 1 week, no meds , will send in,1 month, refer to Neurology, f/u with pCP

## 2023-08-09 NOTE — Progress Notes (Signed)
   Jennifer Wright     MRN: [id number]      DOB: Oct 26, 1965  Chief Complaint  Patient presents with   Headache    Headache nausea x 1 week     HPI Jennifer Wright is here fwith a 1 week h/o headache and nausea She suffers from migraines and has been out f her medicaion which controls her symptoms for several weeks Denies any new weakness , numbness, vision change Would like to re  establish with Neurology as well as needing medicatio now to abort current headache   ROS Denies recent fever or chills. Denies sinus pressure, nasal congestion, ear pain or sore throat. Denies chest congestion, productive cough or wheezing. PE  BP 120/82 (BP Location: Right Arm, Patient Position: Sitting, Cuff Size: Large)   Pulse 76   Ht 5\' 2"  (1.575 m)   Wt 136 lb 1.9 oz (61.7 kg)   SpO2 97%   BMI 24.90 kg/m   Patient alert and oriented and in no cardiopulmonary distress.  HEENT: No facial asymmetry, EOMI,     Neck supple .  Chest: Clear to auscultation bilaterally.  CVS: S1, S2 no murmurs, no S3.Regular rate.  .   Ext: No edema CNS: CN 2-12 intact,.no focal deficits noted.   Assessment & Plan  Migraine without aura and without status migrainosus, not intractable Uncontrolled x 1 week, no meds , will send in,1 month, refer to Neurology, f/u with pCP

## 2023-08-13 ENCOUNTER — Telehealth: Payer: Self-pay | Admitting: Internal Medicine

## 2023-08-13 ENCOUNTER — Other Ambulatory Visit: Payer: Self-pay

## 2023-08-13 DIAGNOSIS — G43009 Migraine without aura, not intractable, without status migrainosus: Secondary | ICD-10-CM

## 2023-08-13 MED ORDER — SUMATRIPTAN SUCCINATE 100 MG PO TABS: ORAL_TABLET | ORAL | 0 refills | Status: DC

## 2023-08-13 NOTE — Telephone Encounter (Signed)
Usually insurance will not pay for more than 9 so rx was resent for quantity of 9

## 2023-08-13 NOTE — Telephone Encounter (Signed)
Copied from CRM [id number]. Topic: Clinical - Prescription Issue >> Aug 12, 2023  3:30 PM Sasha H wrote: Reason for CRM: Pt states that the migraine medication she was prescribed on 11/14, the pharmacy told her the insurance needs a PA for it.

## 2023-08-13 NOTE — Telephone Encounter (Signed)
Patient is needing pre auth on  SUMAtriptan (IMITREX) 100 MG tablet [[id number]]

## 2023-08-13 NOTE — Telephone Encounter (Signed)
Aimovig PA submitted. Waiting on decision
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
## 2023-08-18 ENCOUNTER — Ambulatory Visit: Payer: 59 | Admitting: Neurology

## 2023-08-18 ENCOUNTER — Encounter: Payer: Self-pay | Admitting: Neurology

## 2023-08-18 VITALS — BP 132/65 | HR 70 | Ht 62.0 in | Wt 139.0 lb

## 2023-08-18 DIAGNOSIS — G43711 Chronic migraine without aura, intractable, with status migrainosus: Secondary | ICD-10-CM | POA: Diagnosis not present

## 2023-08-18 DIAGNOSIS — G43009 Migraine without aura, not intractable, without status migrainosus: Secondary | ICD-10-CM

## 2023-08-18 MED ORDER — ONDANSETRON HCL 4 MG PO TABS: 4.0000 mg | ORAL_TABLET | Freq: Three times a day (TID) | ORAL | 5 refills | Status: AC | PRN

## 2023-08-18 MED ORDER — SUMATRIPTAN SUCCINATE 100 MG PO TABS: ORAL_TABLET | ORAL | 5 refills | Status: DC

## 2023-08-18 MED ORDER — AIMOVIG 140 MG/ML ~~LOC~~ SOAJ: 140.0000 mg | SUBCUTANEOUS | 11 refills | Status: DC

## 2023-08-18 NOTE — Progress Notes (Signed)
Medication Samples have been provided to the patient.  Drug name: Bernita Raisin       Strength: 100 mg        Qty: 4  LOT: [id number]  Exp.Date: 09/2024  Dosing instructions: as needed  The patient has been instructed regarding the correct time, dose, and frequency of taking this medication, including desired effects and most common side effects.   Leida Lauth 8:38 AM 08/18/2023

## 2023-08-18 NOTE — Patient Instructions (Addendum)
Start Aimovig 140mg  injection every 28 days Stop Excedrin. When you get a migraine, take Ubrelvy at earliest onset of headache.  You may repeat dose after 2 hours if needed.  Maximum 2 tablets in 24 hours.  Let me know if it works. If Bernita Raisin doesn't work, you may take the sumatriptan.  However, limit to no more than 2 days out of the week to prevent rebound headache. Take ondansetron for the nausea Stop caffeine intake (coffee) Keep hydrated with water. Keep headache diary. Follow up 5 months.     1. Inicie la inyeccin de Aimovig 140 mg cada 28 das. 2. Deje de tomar Excedrin. 3. Cuando tenga migraa, tome Ubrelvy tan pronto como aparezca el dolor de [place].  Puede repetir la dosis despus de 2 horas si es necesario.  Mximo 2 comprimidos en 24 horas.  Djame saber si funciona. 4. Si Ubrelvy no funciona, puede tomar sumatriptn.  Sin embargo, limtelo a no ms de 2 das a la semana para Psychologist, sport and exercise de cabeza de rebote. 5. Tome ondansetrn para las nuseas. 6. Dejar de consumir cafena (caf) 7. Mantngase hidratado con agua. 8. Lleve un diario de dolores de [place]. 9. Seguimiento 5 meses.

## 2023-09-14 ENCOUNTER — Ambulatory Visit: Payer: 59 | Admitting: Internal Medicine

## 2023-10-26 ENCOUNTER — Telehealth: Payer: Self-pay | Admitting: Neurology

## 2023-10-26 NOTE — Telephone Encounter (Signed)
Patient left message on the VM  stating that she needs to speak to someone about her medication    She did not leave the name of the medication

## 2023-10-28 NOTE — Telephone Encounter (Signed)
LMOVM for patient to call the office.

## 2023-10-28 NOTE — Telephone Encounter (Signed)
 Per patient she will need a new Prior Auth under her new Insurance.

## 2023-11-02 NOTE — Telephone Encounter (Signed)
Patient okay to pick up samples of Aimovig 140 mg.

## 2023-11-02 NOTE — Telephone Encounter (Signed)
Pt called in stating she has not had her Aimovig in 2 months. She is still waiting on prior authorization.

## 2023-11-18 ENCOUNTER — Ambulatory Visit: Payer: Self-pay | Admitting: Internal Medicine

## 2023-11-18 ENCOUNTER — Ambulatory Visit: Payer: 59 | Admitting: Family Medicine

## 2023-11-18 ENCOUNTER — Telehealth: Payer: Self-pay

## 2023-11-18 ENCOUNTER — Encounter: Payer: Self-pay | Admitting: Family Medicine

## 2023-11-18 VITALS — BP 117/69 | HR 74 | Ht 62.0 in | Wt 136.0 lb

## 2023-11-18 DIAGNOSIS — G43911 Migraine, unspecified, intractable, with status migrainosus: Secondary | ICD-10-CM

## 2023-11-18 DIAGNOSIS — G43009 Migraine without aura, not intractable, without status migrainosus: Secondary | ICD-10-CM

## 2023-11-18 DIAGNOSIS — G43901 Migraine, unspecified, not intractable, with status migrainosus: Secondary | ICD-10-CM | POA: Insufficient documentation

## 2023-11-18 MED ORDER — AIMOVIG 140 MG/ML ~~LOC~~ SOAJ: 140.0000 mg | SUBCUTANEOUS | 11 refills | Status: DC

## 2023-11-18 MED ORDER — PREDNISONE 10 MG PO TABS: 10.0000 mg | ORAL_TABLET | Freq: Two times a day (BID) | ORAL | 0 refills | Status: DC

## 2023-11-18 MED ORDER — METHYLPREDNISOLONE ACETATE 80 MG/ML IJ SUSP
80.0000 mg | Freq: Once | INTRAMUSCULAR | Status: AC
  Administered 2023-11-18: 80 mg via INTRAMUSCULAR

## 2023-11-18 MED ORDER — SUMATRIPTAN SUCCINATE 100 MG PO TABS: ORAL_TABLET | ORAL | 5 refills | Status: AC

## 2023-11-18 NOTE — Telephone Encounter (Signed)
 Patient seen by PCP office today for migraine. States she was given sample of Aimovig but it may not have worked correctly, she said "nothing came out"?   She has been advised to contact Neuro office for follow up and med assistance.   Message sent as FYI. Thank you!

## 2023-11-18 NOTE — Telephone Encounter (Signed)
 Copied from CRM [id number]. Topic: Clinical - Red Word Triage >> Nov 18, 2023  1:11 PM Alcus Dad H wrote: [facility] that prompted transfer to Nurse Triage: extreme pain in both eyes and a headache that has been going on for 4 days. Said pain is an 8 on scale of 0-10 and is a pulsing pain.   Chief Complaint: Headache x 4 days Symptoms: eye pain Frequency: cosntant Pertinent Negatives: Patient denies fever or head injury Disposition: [] ED /[] Urgent Care (no appt availability in office) / [x] Appointment(In office/virtual)/ []  [facility]/ [] Home Care/ [] Refused Recommended Disposition /[] [facility]/ []  Follow-up with PCP Additional Notes: Migraine headache worsening over last 4 days. Pt has been out of 1 of her medications due to insurance needing prior auth. Pt reports pain unrelieved with Sumatriptan. Appt scheduled for today at 2:40 for eval. Refill request sent HP for review as well.   Reason for Disposition  [1] SEVERE headache (e.g., excruciating) AND [2] not improved after 2 hours of pain medicine  Answer Assessment - Initial Assessment Questions 1. LOCATION: "Where does it hurt?"      Front half of head  2. ONSET: "When did the headache start?" (Minutes, hours or days)      4 days   3. PATTERN: "Does the pain come and go, or has it been constant since it started?"     Constant  4. SEVERITY: "How bad is the pain?" and "What does it keep you from doing?"  (e.g., Scale 1-10; mild, moderate, or severe)   - MILD (1-3): doesn't interfere with normal activities    - MODERATE (4-7): interferes with normal activities or awakens from sleep    - SEVERE (8-10): excruciating pain, unable to do any normal activities        7/10 pain  5. RECURRENT SYMPTOM: "Have you ever had headaches before?" If Yes, ask: "When was the last time?" and "What happened that time?"      Yes, has history  6. CAUSE: "What do you think is causing the headache?"     Has been out of migraine  medicine for 2 months  7. MIGRAINE: "Have you been diagnosed with migraine headaches?" If Yes, ask: "Is this headache similar?"      Yes  8. HEAD INJURY: "Has there been any recent injury to the head?"      No head injury  9. OTHER SYMPTOMS: "Do you have any other symptoms?" (fever, stiff neck, eye pain, sore throat, cold symptoms)     Eye pain;  10. PREGNANCY: "Is there any chance you are pregnant?" "When was your last menstrual period?"       no  Protocols used: Headache-A-AH

## 2023-11-18 NOTE — Patient Instructions (Signed)
 Pls schedule f/u appt with PCP in next 1 to 3 months, call if you need to be seen sooner  If symptoms persist or worsen then you need to go to the ED  Depo Medrol 80 mg IM in office today for headache followed by  a 5 day course of prednisone  I recommend you contact Neurology office for medication approval, as your headaches are uncontrolled  Thanks for choosing  Primary Care, we consider it a privelige to serve you.

## 2023-11-18 NOTE — Assessment & Plan Note (Addendum)
 5 day h/o right periorbital headache poiunding with nausea, recue med not working Depo medro 80 mg iM in office followed by pred 10 mg twice daily for 5 days Needs neurology flollow up, has not had aimovig , needs apprroval thru Neurology apparently,

## 2023-11-21 ENCOUNTER — Encounter: Payer: Self-pay | Admitting: Family Medicine

## 2023-11-21 NOTE — Progress Notes (Signed)
   Jennifer Wright     MRN: [id number]      DOB: 1966-02-17  Chief Complaint  Patient presents with   Migraine    X5 days, taken Imitrex with no relief; bilateral eye pain x3 days; does not have Aimovig; has not contacted Neuro     HPI Jennifer Wright is here with above complaint Essentially she has been unable to fill a prescription for Aimogiv originally given as a trial by her Neurologist and very successful in reducing headache frequency Presents with 5 day h/o uncontrolled headache , unresponsive to immitrex Denies any new weakness or numbness, no vision disturbance  ROS Denies recent fever or chills. Denies sinus pressure, nasal congestion, ear pain or sore throat. Denies chest congestion, productive cough or wheezing. Denies chest pains, palpitations and leg swelling  PE  BP 117/69   Pulse 74   Ht 5\' 2"  (1.575 m)   Wt 136 lb (61.7 kg)   SpO2 96%   BMI 24.87 kg/m   Patient alert and oriented and in no cardiopulmonary distress.in pain  HEENT: No facial asymmetry, EOMI,     Neck supple .  Chest: Clear to auscultation bilaterally.  CVS: S1, S2 no murmurs, no S3.Regular rate.     Ext: No edema  MS: Adequate ROM spine, shoulders, hips and knees.  Skin: Intact, no ulcerations or rash noted.  Psych: Good eye contact, normal affect.  CNS: CN 2-12 intact, power,  normal throughout.no focal deficits noted.   Assessment & Plan  Headache, migraine, with status migrainosus 5 day h/o right periorbital headache poiunding with nausea, recue med not working Depo medro 80 mg iM in office followed by pred 10 mg twice daily for 5 days Needs neurology flollow up, has not had aimovig , needs apprroval thru Neurology apparently,

## 2023-11-22 ENCOUNTER — Telehealth: Payer: Self-pay | Admitting: Pharmacy Technician

## 2023-11-22 ENCOUNTER — Other Ambulatory Visit (HOSPITAL_COMMUNITY): Payer: Self-pay

## 2023-11-22 NOTE — Telephone Encounter (Signed)
 Patient advised per the Prior Auth team, Called the pharmacy. Pt already had a PA on file until November for her primary insurance.  They pharmacy was able to add her medicaid and process the RX for $4. Please let the pt know!

## 2023-11-22 NOTE — Telephone Encounter (Signed)
 Pharmacy Patient Advocate Encounter   Received notification from Pt Calls Messages that prior authorization for Aimovig is required/requested.   Insurance verification completed.   The patient is insured through  [facility] [employer]  .   Per test claim: Refill too soon. PA is not needed at this time. Medication was filled 11/18/23. Next eligible fill date is 12/10/23. PA was already on file for primary ins [facility]) through 08/14/24. Called Pharmacy to verify it was filled and check cost. She was able to add the secondary ([employer]) to process for $4.

## 2023-11-22 NOTE — Telephone Encounter (Signed)
 Patient spoke to on call provider requesting the status of her PA.

## 2023-11-25 ENCOUNTER — Ambulatory Visit: Payer: Self-pay | Admitting: Internal Medicine

## 2023-11-25 NOTE — Telephone Encounter (Signed)
 Copied from CRM [id number]. Topic: Clinical - Red Word Triage >> Nov 25, 2023  1:34 PM Yolanda T wrote: Red Word that prompted transfer to Nurse Triage: patient experiencing headache, eye pain, dry mouth and pain in right knee.  Chief Complaint: right knee pain moderate- goes down leg to foot.  Symptoms: hurts to stand , sleep walk Frequency: was seen in 2023 and again past 4-5 months Pertinent Negatives: Patient denies swelling or redness or fever Disposition: [] ED /[] Urgent Care (no appt availability in office) / [x] Appointment(In office/virtual)/ []  [facility]/ [] Home Care/ [] Refused Recommended Disposition /[] [facility]/ []  Follow-up with PCP Additional Notes:    Reason for Disposition  [1] MODERATE pain (e.g., interferes with normal activities, limping) AND [2] present > 3 days  Answer Assessment - Initial Assessment Questions 1. LOCATION and RADIATION: "Where is the pain located?"      Right down to right foot  2. QUALITY: "What does the pain feel like?"  (e.g., sharp, dull, aching, burning)     Knee to foot-throbbing "pulsating" 3. SEVERITY: "How bad is the pain?" "What does it keep you from doing?"   (Scale 1-10; or mild, moderate, severe)   -  MILD (1-3): doesn't interfere with normal activities    -  MODERATE (4-7): interferes with normal activities (e.g., work or school) or awakens from sleep, limping    -  SEVERE (8-10): excruciating pain, unable to do any normal activities, unable to walk     6-8/10 4. ONSET: "When did the pain start?" "Does it come and go, or is it there all the time?"     4-5 months  7. AGGRAVATING FACTORS: "What makes the knee pain worse?" (e.g., walking, climbing stairs, running)     Walking, standing up,  8. ASSOCIATED SYMPTOMS: "Is there any swelling or redness of the knee?"     no 9. OTHER SYMPTOMS: "Do you have any other symptoms?" (e.g., chest pain, difficulty breathing, fever, calf pain)     Feels like the foot is warmer  when has pain episodes  Protocols used: Knee Pain-A-AH

## 2023-11-26 ENCOUNTER — Ambulatory Visit (INDEPENDENT_AMBULATORY_CARE_PROVIDER_SITE_OTHER): Payer: Self-pay | Admitting: Internal Medicine

## 2023-11-26 ENCOUNTER — Encounter: Payer: Self-pay | Admitting: Internal Medicine

## 2023-11-26 VITALS — BP 127/81 | HR 76 | Ht 62.0 in | Wt 136.2 lb

## 2023-11-26 DIAGNOSIS — M25561 Pain in right knee: Secondary | ICD-10-CM

## 2023-11-26 DIAGNOSIS — G8929 Other chronic pain: Secondary | ICD-10-CM

## 2023-11-26 MED ORDER — CELECOXIB 200 MG PO CAPS: 200.0000 mg | ORAL_CAPSULE | Freq: Two times a day (BID) | ORAL | 0 refills | Status: DC | PRN

## 2023-11-26 NOTE — Assessment & Plan Note (Signed)
 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

## 2023-11-26 NOTE — Progress Notes (Signed)
 Acute Office Visit  Subjective:    Patient ID: Jennifer Wright, female    DOB: 08-Jul-1966, 58 y.o.   MRN: [id number]  Chief Complaint  Patient presents with   Knee Pain    Right knee pain     HPI Patient is in today for complaint of chronic right knee pain.  History is obtained with the help of Spanish interpreter.  She has chronic knee pain, which is constant, dull, mostly on the medial side of the knee.  Her pain is worse since 11/10/23.  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  She has had to miss work due to severe knee pain.  She reports radiating pain into the right leg as well.  Denies any numbness or tingling of the LE, but he has burning pain of the heel area at nighttime.  She has tried taking ibuprofen at times with mild relief.  Past Medical History:  Diagnosis Date   Anxiety    Asbestos exposure    Asthma    Depression    Encounter for screening for malignant neoplasm of colon 12/07/2019   Family history of diabetes mellitus 12/07/2019   Family history of hyperlipidemia 12/07/2019   Generalized headaches 12/07/2019   GERD (gastroesophageal reflux disease)    Hyperlipemia    Neck and shoulder pain 12/07/2019   Shortness of breath 12/07/2019   Thyroid disease    Upper back pain 12/07/2019    Past Surgical History:  Procedure Laterality Date   ABDOMINAL HYSTERECTOMY     TUBAL LIGATION      Family History  Problem Relation Age of Onset   Cancer Mother        stomach cancer   Cancer Brother        stomach cancer   Colon cancer Neg Hx    Colon polyps Neg Hx     Social History   Socioeconomic History   Marital status: Divorced    Spouse name: Not on file   Number of children: 6   Years of education: Not on file   Highest education level: 1st grade  Occupational History   Not on file  Tobacco Use   Smoking status: Never   Smokeless tobacco: Never  Vaping Use   Vaping status:  Never Used  Substance and Sexual Activity   Alcohol use: Yes    Comment: socially   Drug use: Never   Sexual activity: Yes    Birth control/protection: Condom, Surgical  Other Topics Concern   Not on file  Social History Narrative   Moved from [place] at 34 years old   6 children   Lives with daughter -Corrie Dandy and finace' and her two children       Enjoys working around the house      Diet: eats all food groups   Caffeine: coffee 2-3 cups   Water: 4-6 cups daily      Wears seat belt    Does not drive, but can.   Smoke detectors at home           Social Drivers of Health   Financial Resource Strain: Medium Risk (12/20/2020)   Overall Financial Resource Strain (CARDIA)    Difficulty of Paying Living Expenses: Somewhat hard  Food Insecurity: Food Insecurity Present (12/20/2020)   Hunger Vital Sign    Worried About Running Out of Food in the Last Year: Sometimes true  Ran Out of Food in the Last Year: Sometimes true  Transportation Needs: No Transportation Needs (12/20/2020)   PRAPARE - Administrator, Civil Service (Medical): No    Lack of Transportation (Non-Medical): No  Physical Activity: Insufficiently Active (12/20/2020)   Exercise Vital Sign    Days of Exercise per Week: 2 days    Minutes of Exercise per Session: 30 min  Stress: No Stress Concern Present (12/20/2020)   [employer] of Occupational Health - Occupational Stress Questionnaire    Feeling of Stress : Only a little  Social Connections: Socially Isolated (12/20/2020)   Social Connection and Isolation Panel [NHANES]    Frequency of Communication with Friends and Family: More than three times a week    Frequency of Social Gatherings with Friends and Family: Twice a week    Attends Religious Services: Never    Database administrator or Organizations: No    Attends Banker Meetings: Never    Marital Status: Divorced  Catering manager Violence: Not At Risk (12/20/2020)   Humiliation,  Afraid, Rape, and Kick questionnaire    Fear of Current or Ex-Partner: No    Emotionally Abused: No    Physically Abused: No    Sexually Abused: No    Outpatient Medications Prior to Visit  Medication Sig Dispense Refill   Erenumab-aooe (AIMOVIG) 140 MG/ML SOAJ Inject 140 mg into the skin every 28 (twenty-eight) days. (Patient not taking: Reported on 11/18/2023) 1.12 mL 11   fluticasone (FLONASE) 50 MCG/ACT nasal spray Place 2 sprays into both nostrils daily. 16 g 6   ondansetron (ZOFRAN) 4 MG tablet Take 1 tablet (4 mg total) by mouth every 8 (eight) hours as needed for nausea or vomiting. 20 tablet 5   pantoprazole (PROTONIX) 40 MG tablet Take 1 tablet (40 mg total) by mouth daily. 90 tablet 0   SUMAtriptan (IMITREX) 100 MG tablet TAKE 1 TABLET BY MOUTH AT EARLIEST ONSET OF HEADACHE. MAY REPEAT IN 2 HOURS IF HEADACHE PERSISTS OR RECURS. MAXIMUM 2 TABLETS IN 24 HOURS. 9 tablet 5   VENTOLIN HFA 108 (90 Base) MCG/ACT inhaler INHALE 1 PUFF BY MOUTH EVERY 6 HOURS AS NEEDED FOR WHEEZING OR SHORTNESS OF BREATH 18 g 0   ibuprofen (ADVIL) 600 MG tablet Take one tablet by mouth with immitrex for uncontrolled headache , may repeat ion 8  hours f needed. Maximum is twice weekly 20 tablet 0   predniSONE (DELTASONE) 10 MG tablet Take 1 tablet (10 mg total) by mouth 2 (two) times daily with a meal. 10 tablet 0   No facility-administered medications prior to visit.    No Known Allergies  Review of Systems  Constitutional:  Negative for chills and fever.  HENT:  Negative for congestion and sore throat.   Eyes:  Negative for pain and discharge.  Respiratory:  Negative for cough and shortness of breath.   Cardiovascular:  Negative for chest pain and palpitations.  Gastrointestinal:  Negative for abdominal pain, diarrhea, nausea and vomiting.  Endocrine: Negative for polydipsia and polyuria.  Genitourinary:  Negative for dysuria and hematuria.  Musculoskeletal:  Positive for arthralgias (Right knee).  Negative for neck pain and neck stiffness.  Skin:  Negative for rash.  Neurological:  Positive for headaches. Negative for dizziness and weakness.  Psychiatric/Behavioral:  Negative for agitation and behavioral problems.        Objective:    Physical Exam Vitals reviewed.  Constitutional:      General: She is  not in acute distress.    Appearance: She is not diaphoretic.  Eyes:     General: No scleral icterus.    Extraocular Movements: Extraocular movements intact.  Cardiovascular:     Rate and Rhythm: Normal rate and regular rhythm.     Heart sounds: Normal heart sounds. No murmur heard. Pulmonary:     Breath sounds: Normal breath sounds. No wheezing or rales.  Musculoskeletal:     Right knee: No swelling. Normal range of motion. Tenderness present over the medial joint line.     Right lower leg: No edema.     Left lower leg: No edema.  Skin:    General: Skin is warm.     Findings: No rash.  Neurological:     General: No focal deficit present.     Mental Status: She is alert and oriented to person, place, and time.  Psychiatric:        Mood and Affect: Mood normal.        Behavior: Behavior normal.     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 Celebrex as needed for knee pain Can take Tylenol in between for mild to moderate pain X-ray of the knee in 2023 was negative for OA Due to recent worsening of knee pain, will repeat x-ray of knee If x-ray does not show any acute changes, could be  due to ligament injury and/or patellofemoral syndrome Work note provided to avoid prolonged standing for more than 2 hours constantly Leg elevation as tolerated Referred to PT -simple knee exercises material provided today      Relevant Medications   celecoxib (CELEBREX) 200 MG capsule   Other Relevant Orders   DG Knee Complete 4 Views Right   Ambulatory referral to Physical Therapy     Meds ordered this encounter  Medications   celecoxib (CELEBREX) 200 MG capsule    Sig: Take 1 capsule (200 mg total) by mouth 2 (two) times daily as needed for moderate pain (pain score 4-6) or mild pain (pain score 1-3).    Dispense:  60 capsule    Refill:  0     Melvenia Favela Concha Se, MD

## 2023-11-26 NOTE — Patient Instructions (Signed)
 Please start taking Celecoxib as needed for knee pain.  Please avoid prolonged standing for more than 2 hours at a time.  Please perform leg elevation as tolerated.  Please get x-ray of knee done.  You are being referred to physical therapy.

## 2023-12-21 ENCOUNTER — Other Ambulatory Visit: Payer: Self-pay | Admitting: Internal Medicine

## 2023-12-21 DIAGNOSIS — G8929 Other chronic pain: Secondary | ICD-10-CM

## 2024-01-17 ENCOUNTER — Other Ambulatory Visit: Payer: Self-pay | Admitting: Internal Medicine

## 2024-01-17 DIAGNOSIS — G8929 Other chronic pain: Secondary | ICD-10-CM
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
## 2024-01-25 ENCOUNTER — Ambulatory Visit: Payer: 59 | Admitting: Neurology

## 2024-02-02 ENCOUNTER — Other Ambulatory Visit (HOSPITAL_COMMUNITY): Payer: Self-pay

## 2024-02-02 ENCOUNTER — Telehealth: Payer: Self-pay | Admitting: Pharmacy Technician

## 2024-02-02 NOTE — Telephone Encounter (Signed)
 Pharmacy Patient Advocate Encounter  Received notification from [facility] that Prior Authorization for Aimovig  140MG /ML auto-injectors has been CANCELLED due to  PA #/Case ID/Reference #: [id number]  TEST CLAIM STATES PATIENT HAS OTHER PRIMARY INSURANCE. PATIENT WILL NEED TO CONTACT [employer] [place] [employer] AND HAVE HER COORDINATION OF BENEFITS CORRECTED.

## 2024-02-02 NOTE — Telephone Encounter (Signed)
 Pharmacy Patient Advocate Encounter   Received notification from CoverMyMeds that prior authorization for Aimovig  140MG /ML auto-injectors is required/requested.   Insurance verification completed.   The patient is insured through [facility] .   Per test claim: PA required; PA started via CoverMyMeds. KEY [id number] . Waiting for clinical questions to populate.

## 2024-02-17 ENCOUNTER — Other Ambulatory Visit: Payer: Self-pay | Admitting: Internal Medicine

## 2024-02-17 DIAGNOSIS — G8929 Other chronic pain: Secondary | ICD-10-CM

## 2024-02-18 ENCOUNTER — Telehealth: Payer: Self-pay | Admitting: Neurology

## 2024-02-18 MED ORDER — AIMOVIG 140 MG/ML ~~LOC~~ SOAJ: 140.0000 mg | SUBCUTANEOUS | 0 refills | Status: DC

## 2024-02-18 NOTE — Telephone Encounter (Signed)
 Rx of Aimovig  needs refill, Pt. Now on sched for 02/25/24
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
## 2024-02-25 ENCOUNTER — Other Ambulatory Visit (HOSPITAL_COMMUNITY): Payer: Self-pay

## 2024-02-25 ENCOUNTER — Ambulatory Visit: Admitting: Neurology

## 2024-02-25 ENCOUNTER — Encounter: Payer: Self-pay | Admitting: Neurology

## 2024-02-25 ENCOUNTER — Telehealth: Payer: Self-pay

## 2024-02-25 ENCOUNTER — Telehealth: Payer: Self-pay | Admitting: Pharmacy Technician

## 2024-02-25 VITALS — BP 126/71 | HR 75 | Ht 62.0 in | Wt 138.0 lb

## 2024-02-25 DIAGNOSIS — G43009 Migraine without aura, not intractable, without status migrainosus: Secondary | ICD-10-CM

## 2024-02-25 MED ORDER — AIMOVIG 140 MG/ML ~~LOC~~ SOAJ: 140.0000 mg | SUBCUTANEOUS | 5 refills | Status: DC

## 2024-02-25 NOTE — Telephone Encounter (Signed)
 Patient seen in office today, Per patient she was advised that the Aimovig  needed a PA again.  But she was able to pickup the month before in April.   Advised patient we will have the PA team check on it.   Samples given today.

## 2024-02-25 NOTE — Telephone Encounter (Signed)
 Patients insurance is showing another primary coverage. We would either need this other coverage or the patient would need to contact the plan to correct this.

## 2024-02-25 NOTE — Patient Instructions (Addendum)
 Continue Aimovig  injection every 4 weeks At earliest onset of migraine, take Nurtec.  Take 1 tablet daily as needed.  Let me know if it works If it doesn't work, then we will try a different medication. Limit use of pain relievers to no more than 9 days out of the month to prevent risk of rebound or medication-overuse headache. Keep headache diary Follow up 6 months.    1. Contine con la inyeccin de Aimovig  cada 4 semanas. 2. Al inicio de la migraa, tome Nurtec. Tome 1 comprimido al da segn sea necesario. Avseme si funciona. 3. Si no funciona, probaremos con otro medicamento. 4. Limite el uso de analgsicos a no ms de [id number] al mes para prevenir el riesgo de cefalea de rebote o por abuso de medicamentos. 5. Lleve un diario de las cefaleas. 6. Haga un seguimiento cada 6 meses.

## 2024-02-25 NOTE — Telephone Encounter (Signed)
 Pharmacy Patient Advocate Encounter   Received notification from Pt Calls Messages that prior authorization for AIMOVIG  140MG  is required/requested.   Insurance verification completed.   The patient is insured through [facility] .   Per test claim: PA required; PA submitted to above mentioned insurance via CoverMyMeds Key/confirmation #/EOC [id number] Status is pending

## 2024-02-25 NOTE — Progress Notes (Signed)
 Medication Samples have been provided to the patient.  Drug name: Aimovig        Strength: 140 mg        Qty: 1  LOT: [id number]  Exp.Date: 06/20/24  Dosing instructions: every 28 day  The patient has been instructed regarding the correct time, dose, and frequency of taking this medication, including desired effects and most common side effects.   Jennifer Wright Jennifer Wright 2:56 PM 02/25/2024    Medication Samples have been provided to the patient.  Drug name: Nurtec       Strength: 75 mg        Qty: 2   LOT: [id number]  Exp.Date: 8/27  Dosing instructions: as needed  The patient has been instructed regarding the correct time, dose, and frequency of taking this medication, including desired effects and most common side effects.   Jennifer Wright 2:57 PM 02/25/2024

## 2024-02-25 NOTE — Telephone Encounter (Signed)
 PA has been submitted, and telephone encounter has been created. Please see telephone encounter dated 6.6.25.  Test billing results do show that patient has other primary insurance. If the PA is approved, there would still be a problem filling it at the pharmacy since it shows that patient has other primary coverage. Pt would need to call insurance if their is a discrepancy of other coverage showing.

## 2024-02-28 NOTE — Telephone Encounter (Signed)
 Mychart message sent to patient.

## 2024-03-15 ENCOUNTER — Other Ambulatory Visit (HOSPITAL_COMMUNITY): Payer: Self-pay

## 2024-03-28 ENCOUNTER — Other Ambulatory Visit: Payer: Self-pay | Admitting: Internal Medicine

## 2024-03-28 ENCOUNTER — Other Ambulatory Visit (HOSPITAL_COMMUNITY): Payer: Self-pay

## 2024-03-28 DIAGNOSIS — G8929 Other chronic pain: Secondary | ICD-10-CM

## 2024-05-12 ENCOUNTER — Encounter: Payer: Self-pay | Admitting: Radiology

## 2024-06-02 ENCOUNTER — Telehealth: Payer: Self-pay | Admitting: Neurology

## 2024-06-02 MED ORDER — AIMOVIG 140 MG/ML ~~LOC~~ SOAJ: 140.0000 mg | SUBCUTANEOUS | 5 refills | Status: DC

## 2024-06-02 NOTE — Telephone Encounter (Signed)
 Pt is in the office stating that she has been out of Rx AIMOVIG  140 MG for over 3 months (since April).  Pts last name was what needed to be changed per the insurance company and she took care of it today via phone.

## 2024-06-02 NOTE — Telephone Encounter (Signed)
 Advised patient we will send in new script with new last name.   May need a new PA not sure. Please call us  if she do.  Patient last seen 02/2024, move follow up to 10/2024 to give time for Aimovig  to work. Or if she is unable to pick up.  Patient made aware to call if she has any further issues.

## 2024-06-06 ENCOUNTER — Telehealth: Payer: Self-pay | Admitting: Neurology

## 2024-06-06 NOTE — Telephone Encounter (Signed)
 Pt called in this afternoon and she stated that her prescription  called : Rx AIMOVIG  140 MG , is still not at the pharmacy. Please call. Thanks

## 2024-06-07 MED ORDER — AIMOVIG 140 MG/ML ~~LOC~~ SOAJ: 140.0000 mg | SUBCUTANEOUS | 5 refills | Status: AC

## 2024-06-07 NOTE — Telephone Encounter (Signed)
 Resent.  NEW PA needed.

## 2024-06-08 ENCOUNTER — Other Ambulatory Visit (HOSPITAL_COMMUNITY): Payer: Self-pay

## 2024-06-08 NOTE — Telephone Encounter (Signed)
 Unable to submit PA request. Insurance still shows patient has other insurance. Pt will need to call to have other insurance removed before it can be processed.

## 2024-06-08 NOTE — Telephone Encounter (Signed)
 Patient advised iva mychart and phone.

## 2024-06-19 ENCOUNTER — Telehealth: Payer: Self-pay | Admitting: Pharmacy Technician

## 2024-06-19 ENCOUNTER — Telehealth: Payer: Self-pay | Admitting: Neurology

## 2024-06-19 ENCOUNTER — Other Ambulatory Visit (HOSPITAL_COMMUNITY): Payer: Self-pay

## 2024-06-19 NOTE — Telephone Encounter (Signed)
 Donald from [employer] is calling in and stating that she needs the prior authorization for the  Rx AIMOVIG  140 MG . Can send by fax which the number is [phone] or online. Thanks

## 2024-06-19 NOTE — Telephone Encounter (Signed)
 Pt is calling for PA to be sent as she can not get Rx

## 2024-06-19 NOTE — Telephone Encounter (Signed)
 PA has been submitted, and telephone encounter has been created. Please see telephone encounter dated 9.29.25.

## 2024-06-19 NOTE — Telephone Encounter (Signed)
 Pharmacy Patient Advocate Encounter   Received notification from Pt Calls Messages that prior authorization for AIMOVIG  140MG  is required/requested.   Insurance verification completed.   The patient is insured through [facility] MEDICAID .   Per test claim: PA required; PA submitted to above mentioned insurance via Latent Key/confirmation #/EOC [id number] Status is pending

## 2024-06-20 NOTE — Telephone Encounter (Signed)
 Pharmacy Patient Advocate Encounter  Received notification from [facility] MEDICAID that Prior Authorization for AIMOVIG  140MG  has been DENIED.  Full denial letter will be uploaded to the media tab. See denial reason below.   PA #/Case ID/Reference #: [id number]

## 2024-06-21 ENCOUNTER — Telehealth: Payer: Self-pay | Admitting: Pharmacist

## 2024-06-21 NOTE — Telephone Encounter (Signed)
 Additional information was requested, completed and faxed back to [phone] on 06/21/2024 @2 :59 pm.

## 2024-06-21 NOTE — Telephone Encounter (Signed)
 Appeal has been submitted. Will advise when response is received, please be advised that most companies may take 30 days to make a decision. Appeal letter and supporting documentation have been faxed to [phone] on 06/21/2024 @11 :55 am.  Thank you, Devere Pandy, PharmD Clinical Pharmacist  [facility]  Direct Dial: [phone]

## 2024-06-22 ENCOUNTER — Other Ambulatory Visit (HOSPITAL_COMMUNITY): Payer: Self-pay

## 2024-06-22 NOTE — Telephone Encounter (Signed)
Patient advised of approval.  

## 2024-06-22 NOTE — Telephone Encounter (Signed)
 Pharmacy Patient Advocate Encounter  Received notification from [facility] [employer] that Prior Authorization for AIMOVIG  140MG  has been APPROVED from 10.2.25 to 1.2.26. Ran test claim, Copay is $4. This test claim was processed through [facility]- copay amounts may vary at other pharmacies due to pharmacy/plan contracts, or as the patient moves through the different stages of their insurance plan.

## 2024-07-24 ENCOUNTER — Encounter: Payer: Self-pay | Admitting: Radiology

## 2024-08-14 ENCOUNTER — Ambulatory Visit: Payer: Self-pay

## 2024-08-14 NOTE — Telephone Encounter (Signed)
 FYI Only or Action Required?: Action required by provider: request for appointment.  Patient was last seen in primary care on 11/26/2023 by Jennifer Suzzane POUR, MD.  Called Nurse Triage reporting Dizziness.  Symptoms began several weeks ago.  Interventions attempted: Nothing.  Symptoms are: unchanged.Pt. Having dizziness, feeling tired, legs weak. No availability, asking to be worked in. Please advise.  Triage Disposition: See Physician Within 24 Hours  Patient/caregiver understands and will follow disposition?: Yes      Copied from CRM #[id number]. Topic: Clinical - Red Word Triage >> Aug 14, 2024  3:11 PM Emylou G wrote: [facility] that prompted transfer to Nurse Triage: dizzy, tired, and feet feel hot - body hurts Answer Assessment - Initial Assessment Questions 1. DESCRIPTION: Describe your dizziness.     dizzy 2. LIGHTHEADED: Do you feel lightheaded? (e.g., somewhat faint, woozy, weak upon standing)     woozy 3. VERTIGO: Do you feel like either you or the room is spinning or tilting? (i.e., vertigo)     no 4. SEVERITY: How bad is it?  Do you feel like you are going to faint? Can you stand and walk? yes 5. ONSET:  When did the dizziness begin?     2 weeks 6. AGGRAVATING FACTORS: Does anything make it worse? (e.g., standing, change in head position)     yes 7. HEART RATE: Can you tell me your heart rate? How many beats in 15 seconds?  (Note: Not all patients can do this.)       no 8. CAUSE: What do you think is causing the dizziness? (e.g., decreased fluids or food, diarrhea, emotional distress, heat exposure, new medicine, sudden standing, vomiting; unknown)     unsure 9. RECURRENT SYMPTOM: Have you had dizziness before? If Yes, ask: When was the last time? What happened that time?     Years ago 10. OTHER SYMPTOMS: Do you have any other symptoms? (e.g., fever, chest pain, vomiting, diarrhea, bleeding)       Tired, legs weak 11. PREGNANCY: Is there  any chance you are pregnant? When was your last menstrual period?       no  Protocols used: Dizziness - Lightheadedness-A-AH

## 2024-08-15 ENCOUNTER — Ambulatory Visit

## 2024-08-15 VITALS — BP 133/83 | HR 81 | Resp 16 | Ht 62.0 in | Wt 135.1 lb

## 2024-08-15 DIAGNOSIS — R42 Dizziness and giddiness: Secondary | ICD-10-CM | POA: Diagnosis not present

## 2024-08-15 DIAGNOSIS — R531 Weakness: Secondary | ICD-10-CM | POA: Diagnosis not present

## 2024-08-15 DIAGNOSIS — E039 Hypothyroidism, unspecified: Secondary | ICD-10-CM

## 2024-08-15 DIAGNOSIS — R7303 Prediabetes: Secondary | ICD-10-CM | POA: Diagnosis not present

## 2024-08-15 NOTE — Telephone Encounter (Signed)
 scheduled

## 2024-08-15 NOTE — Progress Notes (Signed)
 Established Patient Office Visit  Subjective   Patient ID: Jennifer Wright, female    DOB: October 25, 1965  Age: 58 y.o. MRN: [id number]  Chief Complaint  Patient presents with   Dizziness    Pt complains of dizziness x2 weeks. As well as fatigue and weakness     HPI Discussed the use of AI scribe software for clinical note transcription with the patient, who gave verbal consent to proceed.  History of Present Illness    Jennifer Wright is a 57 year old female who presents with dizziness and muscle pain.  Dizziness - Dizziness present for the past two weeks - Described as feeling 'like drunk' - Constant in nature - Occurs regardless of position (standing or sitting)  Myalgia and fatigue - Muscle pain present for the past two weeks - Initially attributed to work but has progressively worsened - Fatigue described as feeling as though she has worked continuously for three days without rest  Headache and migraine management - Receives an injection every 28 days for migraines - Uncertain about recent effectiveness of migraine treatment - Significant headaches experienced this month - Uncertain if headaches are related to overall malaise or injection dosage  Constitutional symptoms - No fever over the past two weeks     Patient Active Problem List   Diagnosis Date Noted   Weakness 08/20/2024   Headache, migraine, with status migrainosus 11/18/2023   Acute medical illness 11/18/2022   Seasonal allergies 11/18/2022   Prediabetes 07/15/2022   Elevated LDL cholesterol level 07/15/2022   Positive screening for depression on 9-item Patient Health Questionnaire (PHQ-9) 07/15/2022   Acute pain of right lower extremity 06/04/2022   Fatigue 06/04/2022   Preventative health care 06/04/2022   Sore throat 03/12/2022   Bronchitis 03/12/2022   Allergy history, drug 11/18/2020   Dry mouth 11/04/2020   Right wrist pain 11/04/2020   Migraine without aura and without status migrainosus, not  intractable 08/01/2020   Need for immunization against influenza 08/01/2020   Encounter for screening for malignant neoplasm of cervix 08/01/2020   Encounter for screening mammogram for malignant neoplasm of breast 08/01/2020   RUQ pain 05/10/2020   Dizziness 05/07/2020   Facial numbness 05/07/2020   Weakness of both lower extremities 05/07/2020   Acute left-sided low back pain with left-sided sciatica 03/13/2020   Overactive bladder 02/13/2020   Chronic pain of right knee 02/13/2020   Left hip pain 02/13/2020   Gastroesophageal reflux disease 02/13/2020   Urgency of urination 02/13/2020   Mixed hyperlipidemia 02/09/2020   Vitamin D  deficiency 12/07/2019   Hypothyroidism 12/07/2019   Iron deficiency 12/07/2019   Depression, major, single episode, moderate (HCC) 12/07/2019   Shortness of breath 12/07/2019   Language barrier to communication 12/07/2019    ROS    Objective:     BP 133/83   Pulse 81   Resp 16   Ht 5' 2 (1.575 m)   Wt 135 lb 1.3 oz (61.3 kg)   SpO2 97%   BMI 24.71 kg/m  BP Readings from Last 3 Encounters:  08/15/24 133/83  02/25/24 126/71  11/26/23 127/81   Wt Readings from Last 3 Encounters:  08/15/24 135 lb 1.3 oz (61.3 kg)  02/25/24 138 lb (62.6 kg)  11/26/23 136 lb 3.2 oz (61.8 kg)     Physical Exam Vitals and nursing note reviewed. Exam conducted with a chaperone present (pt's husband).  Constitutional:      Appearance: Normal appearance.  HENT:  Head: Normocephalic.     Right Ear: Tympanic membrane, ear canal and external ear normal.     Left Ear: Tympanic membrane, ear canal and external ear normal.     Nose: Nose normal.     Mouth/Throat:     Mouth: Mucous membranes are moist.     Pharynx: Oropharynx is clear.  Eyes:     Extraocular Movements: Extraocular movements intact.     Pupils: Pupils are equal, round, and reactive to light.  Cardiovascular:     Rate and Rhythm: Normal rate and regular rhythm.  Pulmonary:     Effort:  Pulmonary effort is normal.     Breath sounds: Normal breath sounds.  Musculoskeletal:     Cervical back: Normal range of motion and neck supple.  Skin:    General: Skin is warm and dry.  Neurological:     Mental Status: She is alert and oriented to person, place, and time.  Psychiatric:        Mood and Affect: Mood normal.        Thought Content: Thought content normal.     The 10-year ASCVD risk score (Arnett DK, et al., 2019) is: 2.5%    Assessment & Plan:   Problem List Items Addressed This Visit       Endocrine   Hypothyroidism   Last T4 was 1.03 normal.  Recheck thyroid labs today.         Relevant Orders   TSH + free T4 (Completed)     Other   Dizziness   Differential diagnosis includes anemia. - Ordered laboratory tests to evaluate for anemia.      Relevant Orders   CBC with Differential/Platelet (Completed)   TSH + free T4 (Completed)   Basic Metabolic Panel (BMET) (Completed)   Fe+TIBC+Fer (Completed)   Prediabetes   Recheck A1C level.        Relevant Orders   Basic Metabolic Panel (BMET) (Completed)   HgB A1c (Completed)   Weakness - Primary   Update labs today for further evaluation of reported symptoms.        Relevant Orders   CBC with Differential/Platelet (Completed)   Basic Metabolic Panel (BMET) (Completed)   Fe+TIBC+Fer (Completed)  CALL WITH LAB RESULTS.   No follow-ups on file.    Leita Longs, FNP

## 2024-08-16 LAB — HEMOGLOBIN A1C
Est. average glucose Bld gHb Est-mCnc: 120 mg/dL
Hgb A1c MFr Bld: 5.8 % — ABNORMAL HIGH (ref 4.8–5.6)

## 2024-08-16 LAB — BASIC METABOLIC PANEL WITH GFR
BUN/Creatinine Ratio: 15 (ref 9–23)
BUN: 7 mg/dL (ref 6–24)
CO2: 24 mmol/L (ref 20–29)
Calcium: 9.6 mg/dL (ref 8.7–10.2)
Chloride: 101 mmol/L (ref 96–106)
Creatinine, Ser: 0.46 mg/dL — ABNORMAL LOW (ref 0.57–1.00)
Glucose: 91 mg/dL (ref 70–99)
Potassium: 4.2 mmol/L (ref 3.5–5.2)
Sodium: 141 mmol/L (ref 134–144)
eGFR: 112 mL/min/1.73 (ref >59)

## 2024-08-16 LAB — CBC WITH DIFFERENTIAL/PLATELET
Basophils Absolute: 0.1 x10E3/uL (ref 0.0–0.2)
Basos: 1 %
EOS (ABSOLUTE): 0.1 x10E3/uL (ref 0.0–0.4)
Eos: 1 %
Hematocrit: 39.9 % (ref 34.0–46.6)
Hemoglobin: 13.1 g/dL (ref 11.1–15.9)
Immature Grans (Abs): 0 x10E3/uL (ref 0.0–0.1)
Immature Granulocytes: 0 %
Lymphocytes Absolute: 2.2 x10E3/uL (ref 0.7–3.1)
Lymphs: 35 %
MCH: 28.4 pg (ref 26.6–33.0)
MCHC: 32.8 g/dL (ref 31.5–35.7)
MCV: 86 fL (ref 79–97)
Monocytes Absolute: 0.5 x10E3/uL (ref 0.1–0.9)
Monocytes: 9 %
Neutrophils Absolute: 3.3 x10E3/uL (ref 1.4–7.0)
Neutrophils: 54 %
Platelets: 314 x10E3/uL (ref 150–450)
RBC: 4.62 x10E6/uL (ref 3.77–5.28)
RDW: 13 % (ref 11.7–15.4)
WBC: 6.2 x10E3/uL (ref 3.4–10.8)

## 2024-08-16 LAB — TSH+FREE T4
Free T4: 0.94 ng/dL (ref 0.82–1.77)
TSH: 0.048 u[IU]/mL — ABNORMAL LOW (ref 0.450–4.500)

## 2024-08-16 LAB — IRON,TIBC AND FERRITIN PANEL
Ferritin: 88 ng/mL (ref 15–150)
Iron Saturation: 28 % (ref 15–55)
Iron: 93 ug/dL (ref 27–159)
Total Iron Binding Capacity: 338 ug/dL (ref 250–450)
UIBC: 245 ug/dL (ref 131–425)

## 2024-08-20 ENCOUNTER — Other Ambulatory Visit: Payer: Self-pay

## 2024-08-20 ENCOUNTER — Ambulatory Visit: Payer: Self-pay

## 2024-08-20 DIAGNOSIS — R7989 Other specified abnormal findings of blood chemistry: Secondary | ICD-10-CM

## 2024-08-20 DIAGNOSIS — R531 Weakness: Secondary | ICD-10-CM | POA: Insufficient documentation

## 2024-08-20 NOTE — Assessment & Plan Note (Signed)
 Last T4 was 1.03 normal.  Recheck thyroid labs today.

## 2024-08-20 NOTE — Assessment & Plan Note (Signed)
 Update labs today for further evaluation of reported symptoms.

## 2024-08-20 NOTE — Assessment & Plan Note (Signed)
Recheck A1C level  ?

## 2024-08-20 NOTE — Assessment & Plan Note (Signed)
 Differential diagnosis includes anemia. - Ordered laboratory tests to evaluate for anemia.

## 2024-08-21 ENCOUNTER — Ambulatory Visit: Admitting: Neurology

## 2024-08-31 ENCOUNTER — Emergency Department (HOSPITAL_COMMUNITY)
Admission: EM | Admit: 2024-08-31 | Discharge: 2024-08-31 | Disposition: A | Discharge status: Home / Self Care | Attending: Emergency Medicine | Admitting: Emergency Medicine

## 2024-08-31 ENCOUNTER — Other Ambulatory Visit: Payer: Self-pay

## 2024-08-31 ENCOUNTER — Emergency Department (HOSPITAL_COMMUNITY)

## 2024-08-31 ENCOUNTER — Encounter (HOSPITAL_COMMUNITY): Payer: Self-pay

## 2024-08-31 DIAGNOSIS — R059 Cough, unspecified: Secondary | ICD-10-CM | POA: Diagnosis present

## 2024-08-31 DIAGNOSIS — J101 Influenza due to other identified influenza virus with other respiratory manifestations: Secondary | ICD-10-CM | POA: Diagnosis not present

## 2024-08-31 LAB — RESP PANEL BY RT-PCR (RSV, FLU A&B, COVID)  RVPGX2
Influenza A by PCR: POSITIVE — AB
Influenza B by PCR: NEGATIVE
Resp Syncytial Virus by PCR: NEGATIVE
SARS Coronavirus 2 by RT PCR: NEGATIVE

## 2024-08-31 MED ORDER — ACETAMINOPHEN 500 MG PO TABS
1000.0000 mg | ORAL_TABLET | Freq: Once | ORAL | Status: AC
  Administered 2024-08-31: 1000 mg via ORAL
  Filled 2024-08-31: qty 2

## 2024-08-31 NOTE — ED Provider Notes (Signed)
 [place] EMERGENCY DEPARTMENT AT [facility]  Provider Note  CSN: [id number] Arrival date & time: 08/31/24 0003  History Chief Complaint  Patient presents with   Cough   Fever   Video interpreter used Jennifer Wright is a 57 y.o. female here for 2-3 days of cough, congestion, headaches and subjective fever. Not improved with OTC meds.    Home Medications Prior to Admission medications  Medication Sig Start Date End Date Taking? Authorizing Provider  celecoxib  (CELEBREX ) 200 MG capsule TAKE 1 CAPSULE BY MOUTH TWICE DAILY AS NEEDED FOR MODERATE PAIN(PAIN SCORE 4-6) OR MILD PAIN(PAIN SCORE 1-3) 03/28/24   Bevely Doffing, FNP  Erenumab -aooe (AIMOVIG ) 140 MG/ML SOAJ Inject 140 mg into the skin every 28 (twenty-eight) days. 06/07/24   Skeet Juliene SAUNDERS, DO  ondansetron  (ZOFRAN ) 4 MG tablet Take 1 tablet (4 mg total) by mouth every 8 (eight) hours as needed for nausea or vomiting. 08/18/23   Skeet, Adam R, DO  pantoprazole  (PROTONIX ) 40 MG tablet Take 1 tablet (40 mg total) by mouth daily. 06/04/22   Melvenia Manus BRAVO, MD  SUMAtriptan  (IMITREX ) 100 MG tablet TAKE 1 TABLET BY MOUTH AT EARLIEST ONSET OF HEADACHE. MAY REPEAT IN 2 HOURS IF HEADACHE PERSISTS OR RECURS. MAXIMUM 2 TABLETS IN 24 HOURS. 11/18/23   Melvenia Manus BRAVO, MD  VENTOLIN  HFA 108 (90 Base) MCG/ACT inhaler INHALE 1 PUFF BY MOUTH EVERY 6 HOURS AS NEEDED FOR WHEEZING OR SHORTNESS OF BREATH 04/08/22   Paseda, Folashade R, FNP     Allergies    Patient has no known allergies.   Review of Systems   Review of Systems Please see HPI for pertinent positives and negatives  Physical Exam BP 126/73 (BP Location: Right Arm)   Pulse 99   Temp 99.8 F (37.7 C) (Oral)   Resp 18   Ht 5' 2 (1.575 m)   Wt 61.3 kg   SpO2 100%   BMI 24.71 kg/m   Physical Exam Vitals and nursing note reviewed.  Constitutional:      Appearance: Normal appearance.  HENT:     Head: Normocephalic and atraumatic.     Nose: Nose normal.      Mouth/Throat:     Mouth: Mucous membranes are moist.  Eyes:     Extraocular Movements: Extraocular movements intact.     Conjunctiva/sclera: Conjunctivae normal.  Cardiovascular:     Rate and Rhythm: Normal rate.  Pulmonary:     Effort: Pulmonary effort is normal.     Breath sounds: Normal breath sounds. No wheezing or rales.  Abdominal:     General: Abdomen is flat.     Palpations: Abdomen is soft.     Tenderness: There is no abdominal tenderness.  Musculoskeletal:        General: No swelling. Normal range of motion.     Cervical back: Neck supple.  Skin:    General: Skin is warm and dry.  Neurological:     General: No focal deficit present.     Mental Status: She is alert.  Psychiatric:        Mood and Affect: Mood normal.     ED Results / Procedures / Treatments   EKG None  Procedures Procedures  Medications Ordered in the ED Medications  acetaminophen  (TYLENOL ) tablet 1,000 mg (1,000 mg Oral Given 08/31/24 0221)    Initial Impression and Plan  Patient here with viral URI symptoms, exam and vitals reassuring. I personally viewed the images from radiology studies  and agree with radiologist interpretation: CXR is clear, incidental granuloma. Awaiting Covid/Flu/RSV swab. APAP for headache  ED Course   Clinical Course as of 08/31/24 0348  Thu Aug 31, 2024  0347 Covid/Flu/RSV swab is positive for influenza. Given duration of symptoms doubt utility of tamiflu. Recommend OTC symptomatic care, oral hydration and PCP follow up, RTED for any other concerns.   [CS]    Clinical Course User Index [CS] Roselyn Carlin NOVAK, MD     MDM Rules/Calculators/A&P Medical Decision Making Problems Addressed: Influenza A: acute illness or injury  Amount and/or Complexity of Data Reviewed Labs: ordered. Decision-making details documented in ED Course. Radiology: ordered and independent interpretation performed. Decision-making details documented in ED Course.  Risk OTC  drugs.     Final Clinical Impression(s) / ED Diagnoses Final diagnoses:  Influenza A    Rx / DC Orders ED Discharge Orders     None        Roselyn Carlin NOVAK, MD 08/31/24 [phone]

## 2024-08-31 NOTE — ED Triage Notes (Signed)
 Pt to ED with c/o fever, cough, and body aches x 3 days.

## 2024-11-13 ENCOUNTER — Ambulatory Visit: Admitting: Neurology
